# Patient Record
Sex: Female | Born: 1975 | Race: Black or African American | Hispanic: No | Marital: Married | Smoking: Never smoker
Health system: Southern US, Community
[De-identification: ages and names within clinical notes are randomized; demographics above are authoritative.]

## PROBLEM LIST (undated history)

## (undated) DIAGNOSIS — M719 Bursopathy, unspecified: Secondary | ICD-10-CM

## (undated) DIAGNOSIS — I10 Essential (primary) hypertension: Secondary | ICD-10-CM

## (undated) DIAGNOSIS — U071 COVID-19: Secondary | ICD-10-CM

## (undated) DIAGNOSIS — G43909 Migraine, unspecified, not intractable, without status migrainosus: Secondary | ICD-10-CM

## (undated) DIAGNOSIS — E119 Type 2 diabetes mellitus without complications: Secondary | ICD-10-CM

---

## 2004-07-12 ENCOUNTER — Emergency Department: Payer: Self-pay | Admitting: Emergency Medicine

## 2006-04-26 ENCOUNTER — Emergency Department: Payer: Self-pay | Admitting: Emergency Medicine

## 2006-11-04 ENCOUNTER — Emergency Department: Payer: Self-pay | Admitting: General Practice

## 2007-04-02 ENCOUNTER — Emergency Department: Payer: Self-pay | Admitting: Emergency Medicine

## 2007-07-28 ENCOUNTER — Emergency Department: Payer: Self-pay | Admitting: Emergency Medicine

## 2007-11-11 ENCOUNTER — Emergency Department: Payer: Self-pay | Admitting: Emergency Medicine

## 2008-01-07 ENCOUNTER — Emergency Department: Payer: Self-pay | Admitting: Emergency Medicine

## 2009-06-24 ENCOUNTER — Emergency Department: Payer: Self-pay | Admitting: Emergency Medicine

## 2009-10-29 ENCOUNTER — Emergency Department: Payer: Self-pay | Admitting: Emergency Medicine

## 2010-03-31 ENCOUNTER — Emergency Department: Payer: Self-pay | Admitting: Emergency Medicine

## 2010-05-20 ENCOUNTER — Other Ambulatory Visit: Payer: Self-pay | Admitting: Obstetrics and Gynecology

## 2010-07-25 ENCOUNTER — Emergency Department: Payer: Self-pay | Admitting: Emergency Medicine

## 2010-10-10 ENCOUNTER — Emergency Department: Payer: Self-pay | Admitting: Emergency Medicine

## 2011-01-02 ENCOUNTER — Encounter: Payer: Self-pay | Admitting: Maternal & Fetal Medicine

## 2011-01-03 ENCOUNTER — Ambulatory Visit: Payer: Self-pay

## 2011-01-09 ENCOUNTER — Observation Stay: Payer: Self-pay

## 2011-01-10 ENCOUNTER — Ambulatory Visit: Payer: Self-pay | Admitting: Obstetrics and Gynecology

## 2011-01-11 ENCOUNTER — Ambulatory Visit: Payer: Self-pay

## 2011-01-31 ENCOUNTER — Observation Stay: Payer: Self-pay | Admitting: Obstetrics & Gynecology

## 2012-12-18 ENCOUNTER — Emergency Department: Payer: Self-pay | Admitting: Emergency Medicine

## 2012-12-18 LAB — CBC
HCT: 36.4 % (ref 35.0–47.0)
HGB: 12.3 g/dL (ref 12.0–16.0)
MCH: 29.6 pg (ref 26.0–34.0)
MCHC: 34 g/dL (ref 32.0–36.0)
MCV: 87 fL (ref 80–100)
Platelet: 212 10*3/uL (ref 150–440)
RBC: 4.18 10*6/uL (ref 3.80–5.20)
RDW: 13.2 % (ref 11.5–14.5)
WBC: 11.4 10*3/uL — ABNORMAL HIGH (ref 3.6–11.0)

## 2012-12-18 LAB — URINALYSIS, COMPLETE
Bacteria: NONE SEEN
Bilirubin,UR: NEGATIVE
Glucose,UR: NEGATIVE mg/dL (ref 0–75)
Ketone: NEGATIVE
Leukocyte Esterase: NEGATIVE
Nitrite: NEGATIVE
Ph: 7 (ref 4.5–8.0)
Protein: NEGATIVE
RBC,UR: <1 /HPF (ref 0–5)
Specific Gravity: 1.008 (ref 1.003–1.030)
Squamous Epithelial: 1
WBC UR: <1 /HPF (ref 0–5)

## 2012-12-18 LAB — COMPREHENSIVE METABOLIC PANEL
Albumin: 3.7 g/dL (ref 3.4–5.0)
Alkaline Phosphatase: 91 U/L (ref 50–136)
Anion Gap: 8 (ref 7–16)
BUN: 12 mg/dL (ref 7–18)
Bilirubin,Total: 0.4 mg/dL (ref 0.2–1.0)
Calcium, Total: 9.1 mg/dL (ref 8.5–10.1)
Chloride: 105 mmol/L (ref 98–107)
Co2: 24 mmol/L (ref 21–32)
Creatinine: 0.74 mg/dL (ref 0.60–1.30)
EGFR (African American): >60
EGFR (Non-African Amer.): >60
Glucose: 102 mg/dL — ABNORMAL HIGH (ref 65–99)
Osmolality: 274 (ref 275–301)
Potassium: 3.9 mmol/L (ref 3.5–5.1)
SGOT(AST): 16 U/L (ref 15–37)
SGPT (ALT): 34 U/L (ref 12–78)
Sodium: 137 mmol/L (ref 136–145)
Total Protein: 7.8 g/dL (ref 6.4–8.2)

## 2012-12-18 LAB — CK TOTAL AND CKMB (NOT AT ARMC)
CK, Total: 89 U/L (ref 21–215)
CK-MB: <0.5 ng/mL — ABNORMAL LOW (ref 0.5–3.6)

## 2012-12-18 LAB — TROPONIN I: Troponin-I: <0.02 ng/mL

## 2013-09-17 ENCOUNTER — Emergency Department: Payer: Self-pay | Admitting: Emergency Medicine

## 2014-06-25 ENCOUNTER — Emergency Department: Payer: Self-pay | Admitting: Emergency Medicine

## 2014-06-25 LAB — URINALYSIS, COMPLETE
Bacteria: NONE SEEN
Bilirubin,UR: NEGATIVE
Glucose,UR: NEGATIVE mg/dL (ref 0–75)
Ketone: NEGATIVE
Leukocyte Esterase: NEGATIVE
Nitrite: NEGATIVE
Ph: 6 (ref 4.5–8.0)
Protein: NEGATIVE
RBC,UR: 1 /HPF (ref 0–5)
Specific Gravity: 1.005 (ref 1.003–1.030)
Squamous Epithelial: NONE SEEN
WBC UR: NONE SEEN /HPF (ref 0–5)

## 2014-06-25 LAB — CBC
HCT: 35.8 % (ref 35.0–47.0)
HGB: 11.8 g/dL — ABNORMAL LOW (ref 12.0–16.0)
MCH: 29.3 pg (ref 26.0–34.0)
MCHC: 32.9 g/dL (ref 32.0–36.0)
MCV: 89 fL (ref 80–100)
Platelet: 204 10*3/uL (ref 150–440)
RBC: 4.02 10*6/uL (ref 3.80–5.20)
RDW: 13.4 % (ref 11.5–14.5)
WBC: 14 10*3/uL — ABNORMAL HIGH (ref 3.6–11.0)

## 2014-06-25 LAB — HCG, QUANTITATIVE, PREGNANCY: Beta Hcg, Quant.: 45834 m[IU]/mL — ABNORMAL HIGH

## 2014-08-21 ENCOUNTER — Emergency Department: Payer: Self-pay | Admitting: Emergency Medicine

## 2014-08-30 ENCOUNTER — Observation Stay: Payer: Self-pay | Admitting: Obstetrics & Gynecology

## 2015-08-29 ENCOUNTER — Encounter: Payer: Self-pay | Admitting: Emergency Medicine

## 2015-08-29 ENCOUNTER — Emergency Department
Admission: EM | Admit: 2015-08-29 | Discharge: 2015-08-29 | Disposition: A | Discharge status: Home / Self Care | Payer: Worker's Compensation | Attending: Emergency Medicine | Admitting: Emergency Medicine

## 2015-08-29 DIAGNOSIS — M25511 Pain in right shoulder: Secondary | ICD-10-CM | POA: Diagnosis not present

## 2015-08-29 DIAGNOSIS — M542 Cervicalgia: Secondary | ICD-10-CM | POA: Diagnosis present

## 2015-08-29 DIAGNOSIS — M545 Low back pain, unspecified: Secondary | ICD-10-CM

## 2015-08-29 DIAGNOSIS — M7918 Myalgia, other site: Secondary | ICD-10-CM

## 2015-08-29 DIAGNOSIS — I10 Essential (primary) hypertension: Secondary | ICD-10-CM | POA: Insufficient documentation

## 2015-08-29 HISTORY — DX: Essential (primary) hypertension: I10

## 2015-08-29 MED ORDER — CYCLOBENZAPRINE HCL 10 MG PO TABS: 10.0000 mg | ORAL_TABLET | Freq: Three times a day (TID) | ORAL | Status: DC | PRN

## 2015-08-29 MED ORDER — DIAZEPAM 5 MG PO TABS
5.0000 mg | ORAL_TABLET | Freq: Once | ORAL | Status: AC
  Administered 2015-08-29: 5 mg via ORAL
  Filled 2015-08-29: qty 1

## 2015-08-29 MED ORDER — LIDOCAINE 5 % EX PTCH: 1.0000 | MEDICATED_PATCH | CUTANEOUS | Status: AC

## 2015-08-29 MED ORDER — ETODOLAC 200 MG PO CAPS: 200.0000 mg | ORAL_CAPSULE | Freq: Three times a day (TID) | ORAL | Status: DC

## 2015-08-29 MED ORDER — LIDOCAINE 5 % EX PTCH
1.0000 | MEDICATED_PATCH | CUTANEOUS | Status: DC
  Administered 2015-08-29: 1 via TRANSDERMAL
  Filled 2015-08-29 (×2): qty 1

## 2015-08-29 MED ORDER — KETOROLAC TROMETHAMINE 60 MG/2ML IM SOLN
60.0000 mg | Freq: Once | INTRAMUSCULAR | Status: AC
  Administered 2015-08-29: 60 mg via INTRAMUSCULAR
  Filled 2015-08-29: qty 2

## 2015-08-29 NOTE — ED Notes (Signed)
Urine for WC UDS collected and signed over to lab.

## 2015-08-29 NOTE — Discharge Instructions (Signed)
Back Pain, Adult °Back pain is very common in adults. The cause of back pain is rarely dangerous and the pain often gets better over time. The cause of your back pain may not be known. Some common causes of back pain include: °· Strain of the muscles or ligaments supporting the spine. °· Wear and tear (degeneration) of the spinal disks. °· Arthritis. °· Direct injury to the back. °For many people, back pain may return. Since back pain is rarely dangerous, most people can learn to manage this condition on their own. °HOME CARE INSTRUCTIONS °Watch your back pain for any changes. The following actions may help to lessen any discomfort you are feeling: °· Remain active. It is stressful on your back to sit or stand in one place for long periods of time. Do not sit, drive, or stand in one place for more than 30 minutes at a time. Take short walks on even surfaces as soon as you are able. Try to increase the length of time you walk each day. °· Exercise regularly as directed by your health care provider. Exercise helps your back heal faster. It also helps avoid future injury by keeping your muscles strong and flexible. °· Do not stay in bed. Resting more than 1-2 days can delay your recovery. °· Pay attention to your body when you bend and lift. The most comfortable positions are those that put less stress on your recovering back. Always use proper lifting techniques, including: °¨ Bending your knees. °¨ Keeping the load close to your body. °¨ Avoiding twisting. °· Find a comfortable position to sleep. Use a firm mattress and lie on your side with your knees slightly bent. If you lie on your back, put a pillow under your knees. °· Avoid feeling anxious or stressed. Stress increases muscle tension and can worsen back pain. It is important to recognize when you are anxious or stressed and learn ways to manage it, such as with exercise. °· Take medicines only as directed by your health care provider. Over-the-counter  medicines to reduce pain and inflammation are often the most helpful. Your health care provider may prescribe muscle relaxant drugs. These medicines help dull your pain so you can more quickly return to your normal activities and healthy exercise. °· Apply ice to the injured area: °¨ Put ice in a plastic bag. °¨ Place a towel between your skin and the bag. °¨ Leave the ice on for 20 minutes, 2-3 times a day for the first 2-3 days. After that, ice and heat may be alternated to reduce pain and spasms. °· Maintain a healthy weight. Excess weight puts extra stress on your back and makes it difficult to maintain good posture. °SEEK MEDICAL CARE IF: °· You have pain that is not relieved with rest or medicine. °· You have increasing pain going down into the legs or buttocks. °· You have pain that does not improve in one week. °· You have night pain. °· You lose weight. °· You have a fever or chills. °SEEK IMMEDIATE MEDICAL CARE IF:  °· You develop new bowel or bladder control problems. °· You have unusual weakness or numbness in your arms or legs. °· You develop nausea or vomiting. °· You develop abdominal pain. °· You feel faint. °  °This information is not intended to replace advice given to you by your health care provider. Make sure you discuss any questions you have with your health care provider. °  °Document Released: 05/29/2005 Document Revised: 06/19/2014 Document Reviewed: 09/30/2013 °Elsevier Interactive Patient Education ©2016 [employer]  Musculoskeletal Pain Musculoskeletal pain is muscle and boney aches and pains. These pains can occur in any part of the body. Your caregiver may treat you without knowing the cause of the pain. They may treat you if blood or urine tests, X-rays, and other tests were normal.  CAUSES There is often not a definite cause or reason for these pains. These pains may be caused by a type of germ (virus). The discomfort may also come from overuse. Overuse includes working out  too hard when your body is not fit. Boney aches also come from weather changes. Bone is sensitive to atmospheric pressure changes. HOME CARE INSTRUCTIONS   Ask when your test results will be ready. Make sure you get your test results.  Only take over-the-counter or prescription medicines for pain, discomfort, or fever as directed by your caregiver. If you were given medications for your condition, do not drive, operate machinery or power tools, or sign legal documents for 24 hours. Do not drink alcohol. Do not take sleeping pills or other medications that may interfere with treatment.  Continue all activities unless the activities cause more pain. When the pain lessens, slowly resume normal activities. Gradually increase the intensity and duration of the activities or exercise.  During periods of severe pain, bed rest may be helpful. Lay or sit in any position that is comfortable.  Putting ice on the injured area.  Put ice in a bag.  Place a towel between your skin and the bag.  Leave the ice on for 15 to 20 minutes, 3 to 4 times a day.  Follow up with your caregiver for continued problems and no reason can be found for the pain. If the pain becomes worse or does not go away, it may be necessary to repeat tests or do additional testing. Your caregiver may need to look further for a possible cause. SEEK IMMEDIATE MEDICAL CARE IF:  You have pain that is getting worse and is not relieved by medications.  You develop chest pain that is associated with shortness or breath, sweating, feeling sick to your stomach (nauseous), or throw up (vomit).  Your pain becomes localized to the abdomen.  You develop any new symptoms that seem different or that concern you. MAKE SURE YOU:   Understand these instructions.  Will watch your condition.  Will get help right away if you are not doing well or get worse.   This information is not intended to replace advice given to you by your health care  provider. Make sure you discuss any questions you have with your health care provider.   Document Released: 05/29/2005 Document Revised: 08/21/2011 Document Reviewed: 01/31/2013 Elsevier Interactive Patient Education 2016 Elsevier Inc.  Shoulder Pain The shoulder is the joint that connects your arms to your body. The bones that form the shoulder joint include the upper arm bone (humerus), the shoulder blade (scapula), and the collarbone (clavicle). The top of the humerus is shaped like a ball and fits into a rather flat socket on the scapula (glenoid cavity). A combination of muscles and strong, fibrous tissues that connect muscles to bones (tendons) support your shoulder joint and hold the ball in the socket. Small, fluid-filled sacs (bursae) are located in different areas of the joint. They act as cushions between the bones and the overlying soft tissues and help reduce friction between the gliding tendons and the bone as you move your arm. Your shoulder joint allows a wide range of motion in your arm.  This range of motion allows you to do things like scratch your back or throw a ball. However, this range of motion also makes your shoulder more prone to pain from overuse and injury. Causes of shoulder pain can originate from both injury and overuse and usually can be grouped in the following four categories:  Redness, swelling, and pain (inflammation) of the tendon (tendinitis) or the bursae (bursitis).  Instability, such as a dislocation of the joint.  Inflammation of the joint (arthritis).  Broken bone (fracture). HOME CARE INSTRUCTIONS   Apply ice to the sore area.  Put ice in a plastic bag.  Place a towel between your skin and the bag.  Leave the ice on for 15-20 minutes, 3-4 times per day for the first 2 days, or as directed by your health care provider.  Stop using cold packs if they do not help with the pain.  If you have a shoulder sling or immobilizer, wear it as long as your  caregiver instructs. Only remove it to shower or bathe. Move your arm as little as possible, but keep your hand moving to prevent swelling.  Squeeze a soft ball or foam pad as much as possible to help prevent swelling.  Only take over-the-counter or prescription medicines for pain, discomfort, or fever as directed by your caregiver. SEEK MEDICAL CARE IF:   Your shoulder pain increases, or new pain develops in your arm, hand, or fingers.  Your hand or fingers become cold and numb.  Your pain is not relieved with medicines. SEEK IMMEDIATE MEDICAL CARE IF:   Your arm, hand, or fingers are numb or tingling.  Your arm, hand, or fingers are significantly swollen or turn white or blue. MAKE SURE YOU:   Understand these instructions.  Will watch your condition.  Will get help right away if you are not doing well or get worse.   This information is not intended to replace advice given to you by your health care provider. Make sure you discuss any questions you have with your health care provider.   Document Released: 03/08/2005 Document Revised: 06/19/2014 Document Reviewed: 09/21/2014 Elsevier Interactive Patient Education [employer]

## 2015-08-29 NOTE — ED Provider Notes (Signed)
[facility] Emergency Department Provider Note  ____________________________________________  Time seen: Approximately 530 AM  I have reviewed the triage vital signs and the nursing notes.   HISTORY  Chief Complaint Back Pain; Shoulder Pain; and Neck Pain    HPI Helen Webster is a 40 y.o. female who comes into the hospital today with some back pain and neck pain and shoulder pain. The patient reports that she was hurt at work [employer]. The patient was trying to assist a lady off the toilet and the lady fell. The patientports that her arm was still under the lady when she fell. She did not fall onto the floor but her arm, back and neck was pulled. The patient reports that she is having 10 out of 10 pain. She's never had pain like this before. The patient was given some Tylenol at work before she came in but it didn't help. The patient reports that she is able to walk but it hurts when she is moving her neck, arm and back. The patient is here for treatment and evaluation of her symptoms.   Past Medical History  Diagnosis Date  . Hypertension     There are no active problems to display for this patient.   Past Surgical History  Procedure Laterality Date  . Cesarean section      Current Outpatient Rx  Name  Route  Sig  Dispense  Refill  . cyclobenzaprine (FLEXERIL) 10 MG tablet   Oral   Take 1 tablet (10 mg total) by mouth every 8 (eight) hours as needed for muscle spasms.   15 tablet   0   . etodolac (LODINE) 200 MG capsule   Oral   Take 1 capsule (200 mg total) by mouth every 8 (eight) hours.   12 capsule   0   . lidocaine (LIDODERM) 5 %   Transdermal   Place 1 patch onto the skin daily. Remove & Discard patch within 12 hours or as directed by MD   10 patch   0     Allergies Review of patient's allergies indicates no known allergies.  History reviewed. No pertinent family history.  Social History Social History  Substance Use Topics  .  Smoking status: Never Smoker   . Smokeless tobacco: None  . Alcohol Use: No    Review of Systems Constitutional: No fever/chills Eyes: No visual changes. ENT: No sore throat. Cardiovascular: Denies chest pain. Respiratory: Denies shortness of breath. Gastrointestinal: No abdominal pain.  No nausea, no vomiting.  No diarrhea.  No constipation. Genitourinary: Negative for dysuria. Musculoskeletal: Back pain, neck pain, shoulder pain Skin: Negative for rash. Neurological: Negative for headaches, focal weakness or numbness.  10-point ROS otherwise negative.  ____________________________________________   PHYSICAL EXAM:  VITAL SIGNS: ED Triage Vitals  Enc Vitals Group     BP 08/29/15 0218 146/87 mmHg     Pulse Rate 08/29/15 0218 80     Resp 08/29/15 0218 18     Temp 08/29/15 0218 98.5 F (36.9 C)     Temp Source 08/29/15 0218 Oral     SpO2 08/29/15 0218 100 %     Weight 08/29/15 0218 180 lb (81.647 kg)     Height 08/29/15 0218  (1.626 m)     Head Cir --      Peak Flow --      Pain Score 08/29/15 0219 8     Pain Loc --      Pain Edu? --  Excl. in GC? --     Constitutional: Alert and oriented. Well appearing and in mild distress. Eyes: Conjunctivae are normal. PERRL. EOMI. Head: Atraumatic. Nose: No congestion/rhinnorhea. Mouth/Throat: Mucous membranes are moist.  Oropharynx non-erythematous. Neck: No cervical spine tenderness to palpation. Cardiovascular: Normal rate, regular rhythm. Grossly normal heart sounds.  Good peripheral circulation. Respiratory: Normal respiratory effort.  No retractions. Lungs CTAB. Gastrointestinal: Soft and nontender. No distention. Positive bowel sounds Musculoskeletal: Lateral neck with tenderness to palpation, pain to palpation of the shoulder as well with inability to lift above the head. Mild tenderness to palpation to right lower back as well no midline tenderness. Neurologic:  Normal speech and language.  Skin:  Skin is  warm, dry and intact.  Psychiatric: Mood and affect are normal. Speech and behavior are normal.  ____________________________________________   LABS (all labs ordered are listed, but only abnormal results are displayed)  Labs Reviewed - No data to display ____________________________________________  EKG  None ____________________________________________  RADIOLOGY  None ____________________________________________   PROCEDURES  Procedure(s) performed: None  Critical Care performed: No  ____________________________________________   INITIAL IMPRESSION / ASSESSMENT AND PLAN / ED COURSE  Pertinent labs & imaging results that were available during my care of the patient were reviewed by me and considered in my medical decision making (see chart for details).  This is a 40 year old female who comes into the hospital today after being hurt at work. The patient rates her pain a 10 out of 10. I did give the patient dose of Toradol as well as Valium and a Lidoderm patch. The patient will be discharged to follow-up with orthopedic surgery who can evaluate her for possible rotator cuff injury. I will give the patient and no further work and have her follow-up. She should avoid strenuous activity until she is seen by orthopedic surgery. The patient will be discharged home. ____________________________________________   FINAL CLINICAL IMPRESSION(S) / ED DIAGNOSES  Final diagnoses:  Musculoskeletal pain  Neck pain  Right-sided low back pain without sciatica  Right shoulder pain      Rebecka ApleyAllison P Belton Peplinski, MD 08/29/15 [phone]

## 2015-08-29 NOTE — ED Notes (Signed)
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 Pt ambulatory with steady gait. No distress noted.

## 2015-08-29 NOTE — ED Notes (Signed)
Sling applied to patients arm.

## 2016-06-24 ENCOUNTER — Emergency Department
Admission: EM | Admit: 2016-06-24 | Discharge: 2016-06-24 | Disposition: A | Discharge status: Home / Self Care | Payer: Medicaid Other | Attending: Emergency Medicine | Admitting: Emergency Medicine

## 2016-06-24 ENCOUNTER — Emergency Department: Payer: Medicaid Other

## 2016-06-24 DIAGNOSIS — J09X2 Influenza due to identified novel influenza A virus with other respiratory manifestations: Secondary | ICD-10-CM | POA: Insufficient documentation

## 2016-06-24 DIAGNOSIS — J101 Influenza due to other identified influenza virus with other respiratory manifestations: Secondary | ICD-10-CM

## 2016-06-24 DIAGNOSIS — I10 Essential (primary) hypertension: Secondary | ICD-10-CM | POA: Insufficient documentation

## 2016-06-24 LAB — INFLUENZA PANEL BY PCR (TYPE A & B)
Influenza A By PCR: POSITIVE — AB
Influenza B By PCR: NEGATIVE

## 2016-06-24 MED ORDER — OSELTAMIVIR PHOSPHATE 75 MG PO CAPS: 75.0000 mg | ORAL_CAPSULE | Freq: Two times a day (BID) | ORAL | 0 refills | Status: AC

## 2016-06-24 MED ORDER — ACETAMINOPHEN 325 MG PO TABS
ORAL_TABLET | ORAL | Status: AC
  Filled 2016-06-24: qty 2

## 2016-06-24 MED ORDER — KETOROLAC TROMETHAMINE 60 MG/2ML IM SOLN
60.0000 mg | Freq: Once | INTRAMUSCULAR | Status: AC
  Administered 2016-06-24: 60 mg via INTRAMUSCULAR
  Filled 2016-06-24: qty 2

## 2016-06-24 MED ORDER — ACETAMINOPHEN 325 MG PO TABS
650.0000 mg | ORAL_TABLET | Freq: Once | ORAL | Status: AC | PRN
  Administered 2016-06-24: 650 mg via ORAL

## 2016-06-24 MED ORDER — OSELTAMIVIR PHOSPHATE 75 MG PO CAPS
75.0000 mg | ORAL_CAPSULE | Freq: Once | ORAL | Status: AC
  Administered 2016-06-24: 75 mg via ORAL
  Filled 2016-06-24: qty 1

## 2016-06-24 NOTE — ED Notes (Signed)
Pt began with flu like sx yesterday . Pt stating that everyone where she works has been sick. Pt has body aches, HA , and CP along with cough and fever.

## 2016-06-24 NOTE — ED Notes (Signed)
Pt given ginger ale to increase her oral intake.

## 2016-06-24 NOTE — ED Triage Notes (Signed)
Pt ambulatory to triage with no difficulty. Pt reports she works at [employer] and they have had several cases of flu there. Pt reports she started last night with flu symptoms and a fever of 101.

## 2016-06-24 NOTE — ED Provider Notes (Signed)
[facility] Emergency Department Provider Note   ____________________________________________   First MD Initiated Contact with Patient 06/24/16 [id number]     (approximate)  I have reviewed the triage vital signs and the nursing notes.   HISTORY  Chief Complaint Influenza    HPI Helen Webster is a 41 y.o. female who comes into the hospital today with flu symptoms. The patient reports that she's having body aches and elevated temperature, cough, sore throat and chills. She reports that there is a lot of fluid nor virus at her job so they have been quarantined because of this. Started having symptoms yesterday. She took Tylenol Cold and flu as well as Alka-Seltzer but it did not help her symptoms. The patient denies any nausea or vomiting and has had some occasional shortness of breath. She is been drinking fluids at home but reports that she still feels unwell. She has no other sick contacts aside from her job. She decided to come in tonight for evaluation.   Past Medical History:  Diagnosis Date  . Hypertension     There are no active problems to display for this patient.   Past Surgical History:  Procedure Laterality Date  . CESAREAN SECTION      Prior to Admission medications   Medication Sig Start Date End Date Taking? Authorizing Provider  cyclobenzaprine (FLEXERIL) 10 MG tablet Take 1 tablet (10 mg total) by mouth every 8 (eight) hours as needed for muscle spasms. 08/29/15   Rebecka ApleyAllison P Kaelen Brennan, MD  etodolac (LODINE) 200 MG capsule Take 1 capsule (200 mg total) by mouth every 8 (eight) hours. 08/29/15   Rebecka ApleyAllison P Khale Nigh, MD  lidocaine (LIDODERM) 5 % Place 1 patch onto the skin daily. Remove & Discard patch within 12 hours or as directed by MD 08/29/15 08/28/16  Rebecka ApleyAllison P Kenith Trickel, MD  oseltamivir (TAMIFLU) 75 MG capsule Take 1 capsule (75 mg total) by mouth 2 (two) times daily. 06/24/16 06/29/16  Rebecka ApleyAllison P Dione Petron, MD    Allergies Patient has no known  allergies.  No family history on file.  Social History Social History  Substance Use Topics  . Smoking status: Never Smoker  . Smokeless tobacco: Not on file  . Alcohol use No    Review of Systems Constitutional:  fever/chills Eyes: No visual changes. ENT:  sore throat. Cardiovascular: Denies chest pain. Respiratory: Cough and occasional shortness of breath. Gastrointestinal: No abdominal pain.  No nausea, no vomiting.  No diarrhea.  No constipation. Genitourinary: Negative for dysuria. Musculoskeletal: Body aches. Skin: Negative for rash. Neurological: Headache  10-point ROS otherwise negative.  ____________________________________________   PHYSICAL EXAM:  VITAL SIGNS: ED Triage Vitals  Enc Vitals Group     BP 06/24/16 0330 (!) 150/98     Pulse Rate 06/24/16 0330 (!) 114     Resp 06/24/16 0330 20     Temp 06/24/16 0330 (!) 101.1 F (38.4 C)     Temp Source 06/24/16 0330 Oral     SpO2 06/24/16 0330 97 %     Weight 06/24/16 0325 170 lb (77.1 kg)     Height 06/24/16 0325 5\' 4"  (1.626 m)     Head Circumference --      Peak Flow --      Pain Score 06/24/16 0326 10     Pain Loc --      Pain Edu? --      Excl. in GC? --     Constitutional: Alert and oriented. Ill appearing and  in moderate distress. Eyes: Conjunctivae are normal. PERRL. EOMI. Head: Atraumatic. Nose: No congestion/rhinnorhea. Mouth/Throat: Mucous membranes are moist.  Oropharynx mildly erythematous. Neck: Supple with no meningismus Cardiovascular: Tachycardia, regular rhythm. Grossly normal heart sounds.  Good peripheral circulation. Respiratory: Normal respiratory effort.  No retractions. Lungs CTAB. Gastrointestinal: Soft and nontender. No distention. Positive bowel sounds Musculoskeletal: No lower extremity tenderness nor edema.   Neurologic:  Normal speech and language.  Skin:  Skin is warm, dry and intact.  Psychiatric: Mood and affect are normal.    ____________________________________________   LABS (all labs ordered are listed, but only abnormal results are displayed)  Labs Reviewed  INFLUENZA PANEL BY PCR (TYPE A & B, H1N1) - Abnormal; Notable for the following:       Result Value   Influenza A By PCR POSITIVE (*)    All other components within normal limits   ____________________________________________  EKG  none ____________________________________________  RADIOLOGY  CXR ____________________________________________   PROCEDURES  Procedure(s) performed: None  Procedures  Critical Care performed: No  ____________________________________________   INITIAL IMPRESSION / ASSESSMENT AND PLAN / ED COURSE  Pertinent labs & imaging results that were available during my care of the patient were reviewed by me and considered in my medical decision making (see chart for details).  This is a 41 year old female who comes into the hospital today with flulike symptoms. The patient did have a temperature when she initially arrived. She received some Tylenol. I will give her a shot of Toradol as well as some Tamiflu as her flu test was positive. I will then reassess the patient and ensure she is able to take by mouth without any vomiting.  Clinical Course as of Jun 25 715  Sat Jun 24, 2016  0707 No active cardiopulmonary disease. DG Chest 2 View [AW]    Clinical Course User Index [AW] Rebecka Apley, MD   The patient's CXR is unremarkable. Her fever and vital signs are improved. She will be discharged.   ____________________________________________   FINAL CLINICAL IMPRESSION(S) / ED DIAGNOSES  Final diagnoses:  Influenza A      NEW MEDICATIONS STARTED DURING THIS VISIT:  New Prescriptions   OSELTAMIVIR (TAMIFLU) 75 MG CAPSULE    Take 1 capsule (75 mg total) by mouth 2 (two) times daily.     Note:  This document was prepared using Dragon voice recognition software and may include unintentional  dictation errors.    Rebecka Apley, MD 06/24/16 [phone]

## 2016-06-24 NOTE — ED Notes (Signed)
Pt verbalized understanding of discharge instructions. NAD at this time. 

## 2016-07-12 ENCOUNTER — Encounter: Payer: Self-pay | Admitting: *Deleted

## 2016-07-12 ENCOUNTER — Emergency Department
Admission: EM | Admit: 2016-07-12 | Discharge: 2016-07-12 | Disposition: A | Discharge status: Home / Self Care | Payer: Self-pay | Attending: Emergency Medicine | Admitting: Emergency Medicine

## 2016-07-12 ENCOUNTER — Emergency Department: Payer: Self-pay

## 2016-07-12 DIAGNOSIS — I10 Essential (primary) hypertension: Secondary | ICD-10-CM | POA: Insufficient documentation

## 2016-07-12 DIAGNOSIS — R05 Cough: Secondary | ICD-10-CM | POA: Insufficient documentation

## 2016-07-12 DIAGNOSIS — R059 Cough, unspecified: Secondary | ICD-10-CM

## 2016-07-12 DIAGNOSIS — J029 Acute pharyngitis, unspecified: Secondary | ICD-10-CM | POA: Insufficient documentation

## 2016-07-12 DIAGNOSIS — R519 Headache, unspecified: Secondary | ICD-10-CM

## 2016-07-12 DIAGNOSIS — Z79899 Other long term (current) drug therapy: Secondary | ICD-10-CM | POA: Insufficient documentation

## 2016-07-12 DIAGNOSIS — R51 Headache: Secondary | ICD-10-CM | POA: Insufficient documentation

## 2016-07-12 MED ORDER — ACETAMINOPHEN 325 MG PO TABS
ORAL_TABLET | ORAL | Status: AC
  Filled 2016-07-12: qty 2

## 2016-07-12 MED ORDER — ACETAMINOPHEN 325 MG PO TABS
650.0000 mg | ORAL_TABLET | Freq: Once | ORAL | Status: AC
  Administered 2016-07-12: 650 mg via ORAL

## 2016-07-12 MED ORDER — AMOXICILLIN 500 MG PO TABS: 500.0000 mg | ORAL_TABLET | Freq: Three times a day (TID) | ORAL | 0 refills | Status: DC

## 2016-07-12 MED ORDER — GUAIFENESIN-CODEINE 100-10 MG/5ML PO SOLN: 10.0000 mL | Freq: Three times a day (TID) | ORAL | 0 refills | Status: DC | PRN

## 2016-07-12 NOTE — ED Provider Notes (Signed)
[facility] Emergency Department Provider Note  ____________________________________________  Time seen: Approximately 3:57 PM  I have reviewed the triage vital signs and the nursing notes.   HISTORY  Chief Complaint Cough   HPI Helen Webster is a 41 y.o. female who presents to the emergency department for evaluation of cough, left side facial pain, sore throat and ear ache. She denies fever. Diagnosed with the flu a few weeks ago.   Past Medical History:  Diagnosis Date  . Hypertension     There are no active problems to display for this patient.   Past Surgical History:  Procedure Laterality Date  . CESAREAN SECTION      Prior to Admission medications   Medication Sig Start Date End Date Taking? Authorizing Provider  amoxicillin (AMOXIL) 500 MG tablet Take 1 tablet (500 mg total) by mouth 3 (three) times daily. 07/12/16   Chinita Pesterari B Laquincy Eastridge, FNP  cyclobenzaprine (FLEXERIL) 10 MG tablet Take 1 tablet (10 mg total) by mouth every 8 (eight) hours as needed for muscle spasms. 08/29/15   Rebecka ApleyAllison P Webster, MD  etodolac (LODINE) 200 MG capsule Take 1 capsule (200 mg total) by mouth every 8 (eight) hours. 08/29/15   Rebecka ApleyAllison P Webster, MD  guaiFENesin-codeine 100-10 MG/5ML syrup Take 10 mLs by mouth 3 (three) times daily as needed. 07/12/16   Chinita Pesterari B Jonee Lamore, FNP  lidocaine (LIDODERM) 5 % Place 1 patch onto the skin daily. Remove & Discard patch within 12 hours or as directed by MD 08/29/15 08/28/16  Rebecka ApleyAllison P Webster, MD    Allergies Patient has no known allergies.  History reviewed. No pertinent family history.  Social History Social History  Substance Use Topics  . Smoking status: Never Smoker  . Smokeless tobacco: Not on file  . Alcohol use No    Review of Systems Constitutional: Negative for fever/chills ENT: Positive for sore throat. Positive for left side facial pain. Cardiovascular: Denies chest pain. Respiratory: Negative for shortness of breath.  Positive for cough. Gastrointestinal: Negative for nausea,  Negative for vomiting.  Negative for diarrhea.  Musculoskeletal: Positive for body aches Skin: Negative  for rash. Neurological: Positive for headaches ____________________________________________   PHYSICAL EXAM:  VITAL SIGNS: ED Triage Vitals [07/12/16 0814]  Enc Vitals Group     BP 140/86     Pulse Rate (!) 106     Resp 18     Temp 98.7 F (37.1 C)     Temp Source Oral     SpO2 96 %     Weight 170 lb (77.1 kg)     Height 5\' 4"  (1.626 m)     Head Circumference      Peak Flow      Pain Score 10     Pain Loc      Pain Edu?      Excl. in GC?     Constitutional: Alert and oriented. Well appearing and in no acute distress. Eyes: Conjunctivae are normal. EOMI. Ears: Bilateral tympanic membranes are normal Nose: No congestion; no rhinnorhea. Mouth/Throat: Mucous membranes are moist.  Oropharynx normal. Tonsils appear 1+ without exudate. Neck: No stridor.  Lymphatic: No cervical lymphadenopathy. Cardiovascular: Normal rate, regular rhythm. Grossly normal heart sounds.  Good peripheral circulation. Respiratory: Normal respiratory effort.  No retractions. Breath sounds clear throughout to auscultation. Gastrointestinal: Soft and nontender.  Musculoskeletal: FROM x 4 extremities.  Neurologic:  Normal speech and language.  Skin:  Skin is warm, dry and intact. No rash noted. Psychiatric:  Mood and affect are normal. Speech and behavior are normal.  ____________________________________________   LABS (all labs ordered are listed, but only abnormal results are displayed)  Labs Reviewed - No data to display ____________________________________________  EKG  Not indicated ____________________________________________  RADIOLOGY  No active cardiopulmonary disease per radiology. ____________________________________________   PROCEDURES  Procedure(s) performed: None  Critical Care performed:  No  ____________________________________________   INITIAL IMPRESSION / ASSESSMENT AND PLAN / ED COURSE     Pertinent labs & imaging results that were available during my care of the patient were reviewed by me and considered in my medical decision making (see chart for details).   -year-old female presenting to the emergency department for evaluation of severe headache and sinus pain with cough. Recent diagnosis of influenza. Chest x-ray today is negative for acute cardiopulmonary abnormality. She'll be treated with oxacillin and Robitussin-AC. She was encouraged to follow up with her primary care provider for symptoms that are not improving over the next few days or return to the emergency department for symptoms that change or worsen if she is unable schedule an appointment. ____________________________________________   FINAL CLINICAL IMPRESSION(S) / ED DIAGNOSES  Final diagnoses:  Sinus headache  Cough    Note:  This document was prepared using Dragon voice recognition software and may include unintentional dictation errors.     Chinita Pester, FNP 07/17/16 2258    Arnaldo Natal, MD 07/19/16 [phone]

## 2016-07-12 NOTE — ED Triage Notes (Signed)
States she was diagnoised with the flu last week, states she continues to have cough and sore throat with ear aches, pt awake and alert in no acute distress

## 2016-07-12 NOTE — ED Notes (Signed)
Nurse first note   States she had the flu about 3 weeks ago .  Now having cough and left sided face pain    Sore throat

## 2016-07-12 NOTE — Discharge Instructions (Signed)
Take your  medications as prescribed. Follow up with your primary care provider for symptoms that are not improving over the week. Return to the ER for symptoms that change or worsen if unable to schedule an appointment.

## 2016-09-20 ENCOUNTER — Emergency Department: Payer: Self-pay

## 2016-09-20 ENCOUNTER — Emergency Department
Admission: EM | Admit: 2016-09-20 | Discharge: 2016-09-20 | Disposition: A | Discharge status: Home / Self Care | Payer: Self-pay | Attending: Emergency Medicine | Admitting: Emergency Medicine

## 2016-09-20 ENCOUNTER — Encounter: Payer: Self-pay | Admitting: Emergency Medicine

## 2016-09-20 DIAGNOSIS — M7551 Bursitis of right shoulder: Secondary | ICD-10-CM | POA: Insufficient documentation

## 2016-09-20 DIAGNOSIS — I10 Essential (primary) hypertension: Secondary | ICD-10-CM | POA: Insufficient documentation

## 2016-09-20 DIAGNOSIS — Z79899 Other long term (current) drug therapy: Secondary | ICD-10-CM | POA: Insufficient documentation

## 2016-09-20 MED ORDER — CYCLOBENZAPRINE HCL 10 MG PO TABS: 10.0000 mg | ORAL_TABLET | Freq: Three times a day (TID) | ORAL | 0 refills | Status: DC | PRN

## 2016-09-20 MED ORDER — CYCLOBENZAPRINE HCL 10 MG PO TABS
10.0000 mg | ORAL_TABLET | Freq: Once | ORAL | Status: AC
  Administered 2016-09-20: 10 mg via ORAL
  Filled 2016-09-20: qty 1

## 2016-09-20 MED ORDER — NAPROXEN 500 MG PO TABS: 500.0000 mg | ORAL_TABLET | Freq: Two times a day (BID) | ORAL | Status: DC

## 2016-09-20 MED ORDER — IBUPROFEN 600 MG PO TABS
600.0000 mg | ORAL_TABLET | Freq: Once | ORAL | Status: AC
  Administered 2016-09-20: 600 mg via ORAL
  Filled 2016-09-20: qty 1

## 2016-09-20 MED ORDER — TRAMADOL HCL 50 MG PO TABS
50.0000 mg | ORAL_TABLET | Freq: Once | ORAL | Status: AC
  Administered 2016-09-20: 50 mg via ORAL
  Filled 2016-09-20: qty 1

## 2016-09-20 MED ORDER — TRAMADOL HCL 50 MG PO TABS: 50.0000 mg | ORAL_TABLET | Freq: Two times a day (BID) | ORAL | 0 refills | Status: DC | PRN

## 2016-09-20 NOTE — ED Triage Notes (Signed)
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  No deformity noted  Positive pulses

## 2016-09-20 NOTE — ED Provider Notes (Signed)
[facility] Emergency Department Provider Note   ____________________________________________   First MD Initiated Contact with Patient 09/20/16 0920     (approximate)  I have reviewed the triage vital signs and the nursing notes.   HISTORY  Chief Complaint Shoulder Pain    HPI Helen Webster is a 41 y.o. female patient complain of right shoulder pain for 3 days. Patient has increased the last 24 hours. 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 Patient is right-hand dominant.His redness pain is 8/10. Patient described a pain as "achy". No palliative measures for this complaint.   Past Medical History:  Diagnosis Date  . Hypertension     There are no active problems to display for this patient.   Past Surgical History:  Procedure Laterality Date  . CESAREAN SECTION      Prior to Admission medications   Medication Sig Start Date End Date Taking? Authorizing Provider  amoxicillin (AMOXIL) 500 MG tablet Take 1 tablet (500 mg total) by mouth 3 (three) times daily. 07/12/16   Chinita Pester, FNP  cyclobenzaprine (FLEXERIL) 10 MG tablet Take 1 tablet (10 mg total) by mouth every 8 (eight) hours as needed for muscle spasms. 08/29/15   Rebecka Apley, MD  cyclobenzaprine (FLEXERIL) 10 MG tablet Take 1 tablet (10 mg total) by mouth 3 (three) times daily as needed. 09/20/16   Joni Reining, PA-C  etodolac (LODINE) 200 MG capsule Take 1 capsule (200 mg total) by mouth every 8 (eight) hours. 08/29/15   Rebecka Apley, MD  guaiFENesin-codeine 100-10 MG/5ML syrup Take 10 mLs by mouth 3 (three) times daily as needed. 07/12/16   Chinita Pester, FNP  naproxen (NAPROSYN) 500 MG tablet Take 1 tablet (500 mg total) by mouth 2 (two) times daily with a meal. 09/20/16   Joni Reining, PA-C  traMADol (ULTRAM) 50 MG tablet Take 1 tablet (50 mg total) by mouth every 12 (twelve) hours as needed. 09/20/16   Joni Reining,  PA-C    Allergies Patient has no known allergies.  No family history on file.  Social History Social History  Substance Use Topics  . Smoking status: Never Smoker  . Smokeless tobacco: Not on file  . Alcohol use No    Review of Systems Constitutional: No fever/chills Eyes: No visual changes. ENT: No sore throat. Cardiovascular: Denies chest pain. Respiratory: Denies shortness of breath. Gastrointestinal: No abdominal pain.  No nausea, no vomiting.  No diarrhea.  No constipation. Genitourinary: Negative for dysuria. Musculoskeletal: Right shoulder pain . Skin: Negative for rash. Neurological: Negative for headaches, focal weakness or numbness. Endocrine:Hypertension ____________________________________________   PHYSICAL EXAM:  VITAL SIGNS: ED Triage Vitals  Enc Vitals Group     BP 09/20/16 0850 (!) 163/109     Pulse Rate 09/20/16 0850 92     Resp 09/20/16 0850 18     Temp 09/20/16 0850 99.2 F (37.3 C)     Temp Source 09/20/16 0850 Oral     SpO2 09/20/16 0850 99 %     Weight 09/20/16 0851 180 lb (81.6 kg)     Height 09/20/16 0851  (1.626 m)     Head Circumference --      Peak Flow --      Pain Score 09/20/16 0849 8     Pain Loc --      Pain Edu? --      Excl. in GC? --  Constitutional: Alert and oriented. Well appearing and in no acute distress. Eyes: Conjunctivae are normal. PERRL. EOMI. Head: Atraumatic. Nose: No congestion/rhinnorhea. Mouth/Throat: Mucous membranes are moist.  Oropharynx non-erythematous. Neck: No stridor.  No cervical spine tenderness to palpation. Hematological/Lymphatic/Immunilogical: No cervical lymphadenopathy. Cardiovascular: Normal rate, regular rhythm. Grossly normal heart sounds.  Good peripheral circulation.Elevated blood pressure Respiratory: Normal respiratory effort.  No retractions. Lungs CTAB. Gastrointestinal: Soft and nontender. No distention. No abdominal bruits. No CVA tenderness. Musculoskeletal: No  obvious deformities to the right shoulder. Patient tender palpation GH joint. Patient also has moderate guarding at the mid medial scapular area. Decreased range of motion's all fields and back complaining of pain.  Neurologic:  Normal speech and language. No gross focal neurologic deficits are appreciated. No gait instability. Skin:  Skin is warm, dry and intact. No rash noted. Psychiatric: Mood and affect are normal. Speech and behavior are normal.  ____________________________________________   LABS (all labs ordered are listed, but only abnormal results are displayed)  Labs Reviewed - No data to display ____________________________________________  EKG   ____________________________________________  RADIOLOGY  Acute findings x-ray of the right shoulder ____________________________________________   PROCEDURES  Procedure(s) performed: None  Procedures  Critical Care performed: No  ____________________________________________   INITIAL IMPRESSION / ASSESSMENT AND PLAN / ED COURSE  Pertinent labs & imaging results that were available during my care of the patient were reviewed by me and considered in my medical decision making (see chart for details).  Right shoulder pain. States rates pending.      ____________________________________________   FINAL CLINICAL IMPRESSION(S) / ED DIAGNOSES  Final diagnoses:  Acute shoulder bursitis, right  Patient given discharge Instructions. Patient is a prescription for Flexeril, naproxen, tramadol. Patient given a work note and advised follow-up family doctor condition persists.    NEW MEDICATIONS STARTED DURING THIS VISIT:  New Prescriptions   CYCLOBENZAPRINE (FLEXERIL) 10 MG TABLET    Take 1 tablet (10 mg total) by mouth 3 (three) times daily as needed.   NAPROXEN (NAPROSYN) 500 MG TABLET    Take 1 tablet (500 mg total) by mouth 2 (two) times daily with a meal.   TRAMADOL (ULTRAM) 50 MG TABLET    Take 1 tablet (50 mg  total) by mouth every 12 (twelve) hours as needed.     Note:  This document was prepared using Dragon voice recognition software and may include unintentional dictation errors.    Joni Reining, PA-C 09/20/16 1036    Emily Filbert, MD 09/20/16 1120

## 2016-12-16 ENCOUNTER — Emergency Department
Admission: EM | Admit: 2016-12-16 | Discharge: 2016-12-16 | Disposition: A | Discharge status: Home / Self Care | Payer: Medicaid Other | Attending: Emergency Medicine | Admitting: Emergency Medicine

## 2016-12-16 ENCOUNTER — Encounter: Payer: Self-pay | Admitting: Emergency Medicine

## 2016-12-16 DIAGNOSIS — Z79899 Other long term (current) drug therapy: Secondary | ICD-10-CM | POA: Insufficient documentation

## 2016-12-16 DIAGNOSIS — M5431 Sciatica, right side: Secondary | ICD-10-CM | POA: Insufficient documentation

## 2016-12-16 DIAGNOSIS — I10 Essential (primary) hypertension: Secondary | ICD-10-CM | POA: Insufficient documentation

## 2016-12-16 DIAGNOSIS — M25511 Pain in right shoulder: Secondary | ICD-10-CM

## 2016-12-16 MED ORDER — PREDNISONE 10 MG PO TABS: ORAL_TABLET | ORAL | 0 refills | Status: DC

## 2016-12-16 MED ORDER — NAPROXEN 500 MG PO TABS: 500.0000 mg | ORAL_TABLET | Freq: Two times a day (BID) | ORAL | 0 refills | Status: DC

## 2016-12-16 MED ORDER — OXYCODONE HCL 5 MG PO TABS
5.0000 mg | ORAL_TABLET | Freq: Once | ORAL | Status: AC
  Administered 2016-12-16: 5 mg via ORAL
  Filled 2016-12-16: qty 1

## 2016-12-16 MED ORDER — METHYLPREDNISOLONE SODIUM SUCC 125 MG IJ SOLR
125.0000 mg | Freq: Once | INTRAMUSCULAR | Status: AC
  Administered 2016-12-16: 125 mg via INTRAMUSCULAR
  Filled 2016-12-16: qty 2

## 2016-12-16 MED ORDER — KETOROLAC TROMETHAMINE 60 MG/2ML IM SOLN
30.0000 mg | Freq: Once | INTRAMUSCULAR | Status: AC
  Administered 2016-12-16: 30 mg via INTRAMUSCULAR
  Filled 2016-12-16: qty 2

## 2016-12-16 NOTE — ED Triage Notes (Signed)
Patient to ER for c/o right shoulder and right hip pain. Has had shoulder checked before and told she had bursitis (pain feels the same per patient). Now having pain to right hip that shoots down leg. 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

## 2016-12-16 NOTE — ED Notes (Signed)

## 2016-12-16 NOTE — ED Notes (Signed)
Urine sent to lab if urinalysis needed.

## 2016-12-16 NOTE — ED Provider Notes (Signed)
[facility] Emergency Department Provider Note  ____________________________________________  Time seen: Approximately 7:48 AM  I have reviewed the triage vital signs and the nursing notes.   HISTORY  Chief Complaint Hip Pain and Shoulder Pain    HPI Helen Webster is a 41 y.o. female that presents to the emergency department with right shoulder pain for 3 months and right hip pain for one week that worsened last night. Patient states that her shoulder pain is in the back of her shoulder. She was told 3 months ago that she has bursitis. X-rays were completed and did not show anything. Shoulder was improving but worsened yesterday. Patient's hip has been hurting at work with walking. She states that she gets a pain in her right buttock that shoots down the back of her leg. Once last night, her legs felt numb. She denies bowel or bladder dysfunction or saddle paresthesias. No trauma. 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 She denies fever, shortness of breath, chest pain, nausea, vomiting, abdominal pain, back pain, dysuria, urgency, frequency, or tingling.   Past Medical History:  Diagnosis Date  . Hypertension     There are no active problems to display for this patient.   Past Surgical History:  Procedure Laterality Date  . CESAREAN SECTION      Prior to Admission medications   Medication Sig Start Date End Date Taking? Authorizing Provider  amoxicillin (AMOXIL) 500 MG tablet Take 1 tablet (500 mg total) by mouth 3 (three) times daily. 07/12/16   Triplett, Rulon Eisenmenger B, FNP  cyclobenzaprine (FLEXERIL) 10 MG tablet Take 1 tablet (10 mg total) by mouth every 8 (eight) hours as needed for muscle spasms. 08/29/15   Rebecka Apley, MD  cyclobenzaprine (FLEXERIL) 10 MG tablet Take 1 tablet (10 mg total) by mouth 3 (three) times daily as needed. 09/20/16   Joni Reining, PA-C  etodolac (LODINE) 200 MG capsule Take 1 capsule (200 mg  total) by mouth every 8 (eight) hours. 08/29/15   Rebecka Apley, MD  guaiFENesin-codeine 100-10 MG/5ML syrup Take 10 mLs by mouth 3 (three) times daily as needed. 07/12/16   Triplett, Rulon Eisenmenger B, FNP  naproxen (NAPROSYN) 500 MG tablet Take 1 tablet (500 mg total) by mouth 2 (two) times daily with a meal. 12/16/16 12/16/17  Enid Derry, PA-C  predniSONE (DELTASONE) 10 MG tablet Take 6 tablets on day 1, take 5 tablets on day 2, take 4 tablets on day 3, take 3 tablets on day 4, take 2 tablets on day 5, take 1 tablet on day 6 12/16/16   Enid Derry, PA-C  traMADol (ULTRAM) 50 MG tablet Take 1 tablet (50 mg total) by mouth every 12 (twelve) hours as needed. 09/20/16   Joni Reining, PA-C    Allergies Patient has no known allergies.  No family history on file.  Social History Social History  Substance Use Topics  . Smoking status: Never Smoker  . Smokeless tobacco: Never Used  . Alcohol use No     Review of Systems  Constitutional: No fever/chills Cardiovascular: No chest pain. Respiratory: No SOB. Gastrointestinal: No abdominal pain.  No nausea, no vomiting.  Genitourinary: Negative for dysuria. Skin: Negative for rash, abrasions, lacerations, ecchymosis. Neurological: Negative for headaches   ____________________________________________   PHYSICAL EXAM:  VITAL SIGNS: ED Triage Vitals  Enc Vitals Group     BP 12/16/16 0731 (!) 174/106     Pulse Rate 12/16/16 0731 100  Resp 12/16/16 0731 18     Temp 12/16/16 0731 98.8 F (37.1 C)     Temp Source 12/16/16 0731 Oral     SpO2 12/16/16 0731 97 %     Weight 12/16/16 0732 175 lb (79.4 kg)     Height 12/16/16 0732 5\' 4"  (1.626 m)     Head Circumference --      Peak Flow --      Pain Score 12/16/16 0731 10     Pain Loc --      Pain Edu? --      Excl. in GC? --      Constitutional: Alert and oriented. Well appearing and in no acute distress. Eyes: Conjunctivae are normal. PERRL. EOMI. Head: Atraumatic. ENT:       Ears:      Nose: No congestion/rhinnorhea.      Mouth/Throat: Mucous membranes are moist.  Neck: No stridor.   Cardiovascular: Normal rate, regular rhythm.  Good peripheral circulation. Respiratory: Normal respiratory effort without tachypnea or retractions. Lungs CTAB. Good air entry to the bases with no decreased or absent breath sounds. Gastrointestinal: Bowel sounds 4 quadrants. Soft and nontender to palpation. No guarding or rigidity. No palpable masses. No distention.  Musculoskeletal: Full range of motion to all extremities. No gross deformities appreciated. Pinpoint tenderness to palpation in upper right back between shoulder and neck. Tenderness to palpation over right hip bursa. No lumbar or SI tenderness. Positive straight leg raise. Neurologic:  Normal speech and language. No gross focal neurologic deficits are appreciated.  Skin:  Skin is warm, dry and intact. No rash noted.   ____________________________________________   LABS (all labs ordered are listed, but only abnormal results are displayed)  Labs Reviewed - No data to display ____________________________________________  EKG   ____________________________________________  RADIOLOGY  No results found.  ____________________________________________    PROCEDURES  Procedure(s) performed:    Procedures    Medications  methylPREDNISolone sodium succinate (SOLU-MEDROL) 125 mg/2 mL injection 125 mg (125 mg Intramuscular Given 12/16/16 0754)  ketorolac (TORADOL) injection 30 mg (30 mg Intramuscular Given 12/16/16 0754)  oxyCODONE (Oxy IR/ROXICODONE) immediate release tablet 5 mg (5 mg Oral Given 12/16/16 0823)     ____________________________________________   INITIAL IMPRESSION / ASSESSMENT AND PLAN / ED COURSE  Pertinent labs & imaging results that were available during my care of the patient were reviewed by me and considered in my medical decision making (see chart for details).  Review of the [place] CSRS  was performed in accordance of the [facility] prior to dispensing any controlled drugs.   Patient presented to the emergency department for evaluation of shoulder pain and hip pain. Vital signs and exam are reassuring. Shoulder pain is likely musculoskeletal. She has been told in the past she has bursitis. Patient has had previous shoulder x-rays completed, which did not show any bony abnormalities. Hip pain is consistent with sciatica. No bowel or bladder dysfunction or saddle paresthesias. She was given Solu-Medrol and Toradol in ED. Patient will be discharged home with prescriptions for prednisone and ibuprofen.. Patient is to follow up with PCP as directed. Patient is given ED precautions to return to the ED for any worsening or new symptoms.     ____________________________________________  FINAL CLINICAL IMPRESSION(S) / ED DIAGNOSES  Final diagnoses:  Sciatica of right side  Right shoulder pain, unspecified chronicity      NEW MEDICATIONS STARTED DURING THIS VISIT:  Discharge Medication List as of 12/16/2016  7:59 AM  START taking these medications   Details  predniSONE (DELTASONE) 10 MG tablet Take 6 tablets on day 1, take 5 tablets on day 2, take 4 tablets on day 3, take 3 tablets on day 4, take 2 tablets on day 5, take 1 tablet on day 6, Print            This chart was dictated using voice recognition software/Dragon. Despite best efforts to proofread, errors can occur which can change the meaning. Any change was purely unintentional.    Enid Derry, PA-C 12/16/16 1305    Don Perking, [place], MD 12/17/16 [phone]

## 2016-12-16 NOTE — ED Notes (Signed)
See triage note. Pt worked as [employer] last night. C/o pain to R shoulder (hx bursitis same site) and R hip that shoots down to foot.

## 2017-02-09 ENCOUNTER — Emergency Department: Payer: Self-pay

## 2017-02-09 ENCOUNTER — Emergency Department
Admission: EM | Admit: 2017-02-09 | Discharge: 2017-02-09 | Disposition: A | Discharge status: Home / Self Care | Payer: Self-pay | Attending: Emergency Medicine | Admitting: Emergency Medicine

## 2017-02-09 ENCOUNTER — Encounter: Payer: Self-pay | Admitting: Emergency Medicine

## 2017-02-09 DIAGNOSIS — R739 Hyperglycemia, unspecified: Secondary | ICD-10-CM | POA: Insufficient documentation

## 2017-02-09 DIAGNOSIS — R55 Syncope and collapse: Secondary | ICD-10-CM | POA: Insufficient documentation

## 2017-02-09 DIAGNOSIS — G44201 Tension-type headache, unspecified, intractable: Secondary | ICD-10-CM | POA: Insufficient documentation

## 2017-02-09 DIAGNOSIS — R531 Weakness: Secondary | ICD-10-CM | POA: Insufficient documentation

## 2017-02-09 DIAGNOSIS — I10 Essential (primary) hypertension: Secondary | ICD-10-CM | POA: Insufficient documentation

## 2017-02-09 DIAGNOSIS — R51 Headache: Secondary | ICD-10-CM

## 2017-02-09 DIAGNOSIS — R519 Headache, unspecified: Secondary | ICD-10-CM

## 2017-02-09 LAB — BASIC METABOLIC PANEL
Anion gap: 9 (ref 5–15)
BUN: 6 mg/dL (ref 6–20)
CO2: 24 mmol/L (ref 22–32)
Calcium: 8.6 mg/dL — ABNORMAL LOW (ref 8.9–10.3)
Chloride: 98 mmol/L — ABNORMAL LOW (ref 101–111)
Creatinine, Ser: 0.6 mg/dL (ref 0.44–1.00)
GFR calc Af Amer: >60 mL/min (ref >60)
GFR calc non Af Amer: >60 mL/min (ref >60)
Glucose, Bld: 395 mg/dL — ABNORMAL HIGH (ref 65–99)
Potassium: 3.7 mmol/L (ref 3.5–5.1)
Sodium: 131 mmol/L — ABNORMAL LOW (ref 135–145)

## 2017-02-09 LAB — URINALYSIS, COMPLETE (UACMP) WITH MICROSCOPIC
Bacteria, UA: NONE SEEN
Bilirubin Urine: NEGATIVE
Glucose, UA: >=500 mg/dL — AB
Hgb urine dipstick: NEGATIVE
Ketones, ur: NEGATIVE mg/dL
Leukocytes, UA: NEGATIVE
Nitrite: NEGATIVE
Protein, ur: 100 mg/dL — AB
Specific Gravity, Urine: 1.03 (ref 1.005–1.030)
pH: 6 (ref 5.0–8.0)

## 2017-02-09 LAB — CBC
HCT: 38.1 % (ref 35.0–47.0)
Hemoglobin: 13.4 g/dL (ref 12.0–16.0)
MCH: 30.7 pg (ref 26.0–34.0)
MCHC: 35.2 g/dL (ref 32.0–36.0)
MCV: 87.1 fL (ref 80.0–100.0)
Platelets: 205 10*3/uL (ref 150–440)
RBC: 4.37 MIL/uL (ref 3.80–5.20)
RDW: 13.1 % (ref 11.5–14.5)
WBC: 11 10*3/uL (ref 3.6–11.0)

## 2017-02-09 LAB — HEPATIC FUNCTION PANEL
ALT: 9 U/L — ABNORMAL LOW (ref 14–54)
AST: 14 U/L — ABNORMAL LOW (ref 15–41)
Albumin: 3.9 g/dL (ref 3.5–5.0)
Alkaline Phosphatase: 89 U/L (ref 38–126)
Bilirubin, Direct: <0.1 mg/dL — ABNORMAL LOW (ref 0.1–0.5)
Total Bilirubin: 0.6 mg/dL (ref 0.3–1.2)
Total Protein: 7.5 g/dL (ref 6.5–8.1)

## 2017-02-09 LAB — LIPASE, BLOOD: Lipase: 33 U/L (ref 11–51)

## 2017-02-09 LAB — GLUCOSE, CAPILLARY
Glucose-Capillary: 384 mg/dL — ABNORMAL HIGH (ref 65–99)
Glucose-Capillary: 314 mg/dL — ABNORMAL HIGH (ref 65–99)
Glucose-Capillary: 301 mg/dL — ABNORMAL HIGH (ref 65–99)
Glucose-Capillary: 291 mg/dL — ABNORMAL HIGH (ref 65–99)
Glucose-Capillary: 265 mg/dL — ABNORMAL HIGH (ref 65–99)

## 2017-02-09 LAB — TROPONIN I: Troponin I: <0.03 ng/mL (ref <0.03)

## 2017-02-09 LAB — POC URINE PREG, ED: Preg Test, Ur: NEGATIVE

## 2017-02-09 MED ORDER — INSULIN ASPART 100 UNIT/ML ~~LOC~~ SOLN
6.0000 [IU] | Freq: Once | SUBCUTANEOUS | Status: AC
  Administered 2017-02-09: 6 [IU] via SUBCUTANEOUS
  Filled 2017-02-09: qty 1

## 2017-02-09 MED ORDER — SODIUM CHLORIDE 0.9 % IV BOLUS (SEPSIS)
1000.0000 mL | Freq: Once | INTRAVENOUS | Status: AC
  Administered 2017-02-09: 1000 mL via INTRAVENOUS

## 2017-02-09 MED ORDER — METOCLOPRAMIDE HCL 5 MG/ML IJ SOLN
10.0000 mg | Freq: Once | INTRAMUSCULAR | Status: AC
  Administered 2017-02-09: 10 mg via INTRAVENOUS
  Filled 2017-02-09: qty 2

## 2017-02-09 MED ORDER — BUTALBITAL-APAP-CAFFEINE 50-325-40 MG PO TABS: 1.0000 | ORAL_TABLET | Freq: Four times a day (QID) | ORAL | 0 refills | Status: DC | PRN

## 2017-02-09 MED ORDER — DIPHENHYDRAMINE HCL 50 MG/ML IJ SOLN
25.0000 mg | Freq: Once | INTRAMUSCULAR | Status: AC
  Administered 2017-02-09: 25 mg via INTRAVENOUS
  Filled 2017-02-09: qty 1

## 2017-02-09 MED ORDER — ONDANSETRON 4 MG PO TBDP: 4.0000 mg | ORAL_TABLET | Freq: Three times a day (TID) | ORAL | 0 refills | Status: DC | PRN

## 2017-02-09 MED ORDER — BUTALBITAL-APAP-CAFFEINE 50-325-40 MG PO TABS
2.0000 | ORAL_TABLET | Freq: Once | ORAL | Status: AC
  Administered 2017-02-09: 2 via ORAL
  Filled 2017-02-09: qty 2

## 2017-02-09 MED ORDER — MAGNESIUM SULFATE 2 GM/50ML IV SOLN
2.0000 g | Freq: Once | INTRAVENOUS | Status: AC
  Administered 2017-02-09: 2 g via INTRAVENOUS
  Filled 2017-02-09: qty 50

## 2017-02-09 MED ORDER — METFORMIN HCL 500 MG PO TABS: 500.0000 mg | ORAL_TABLET | Freq: Two times a day (BID) | ORAL | 0 refills | Status: DC

## 2017-02-09 MED ORDER — KETOROLAC TROMETHAMINE 30 MG/ML IJ SOLN
30.0000 mg | Freq: Once | INTRAMUSCULAR | Status: AC
  Administered 2017-02-09: 30 mg via INTRAVENOUS
  Filled 2017-02-09: qty 1

## 2017-02-09 NOTE — ED Notes (Signed)
BG 314

## 2017-02-09 NOTE — Discharge Instructions (Signed)
Please follow-up with your primary care physician. Please also follow-up with neurology. She to have any return of her symptoms worsened symptoms or any other complaints please return to the emergency department for immediate evaluation. We did discuss lumbar puncture during this visit and the decision was made not to perform a lumbar puncture. She to return a lumbar puncture may be indicated.

## 2017-02-09 NOTE — ED Notes (Signed)
BG: 291

## 2017-02-09 NOTE — ED Triage Notes (Addendum)
Pt to rm 7 via EMS from work at [employer], report near syncopal episode, hypertensive and hyperglycemic upon assessment, BG 496, pt w/o hx of DM, pt denies taking any medications.  Pt report HA, pt not speaking much.  Pt report vomiting x 2 and feeling very weak

## 2017-02-09 NOTE — ED Provider Notes (Signed)
[facility] Emergency Department Provider Note   ____________________________________________   First MD Initiated Contact with Patient 02/09/17 0133     (approximate)  I have reviewed the triage vital signs and the nursing notes.   HISTORY  Chief Complaint Near Syncope and Hyperglycemia    HPI Helen Webster is a 41 y.o. female who comes into the hospital today with a headache. She reports that her head started hurting a couple of hours prior to her coming in. She said that she woke up on the floor. She assumes that she passed out. She reports that she took some Excedrin but does not have a history of migraines or headaches. The patient has had nausea and vomiting. She reports it was nonbloody and nonbilious. The patient states that she's been dizzy and lightheaded in all started this evening. The patient denies any chest pain or shortness of breath. She reports that she has 10 out of 10 pain in her head. She denies any belly pain or problems with urination. The patient is here for evaluation.Per EMS the patient's blood sugar is over 500   Past Medical History:  Diagnosis Date  . Hypertension     There are no active problems to display for this patient.   Past Surgical History:  Procedure Laterality Date  . CESAREAN SECTION      Prior to Admission medications   Medication Sig Start Date End Date Taking? Authorizing Provider  amoxicillin (AMOXIL) 500 MG tablet Take 1 tablet (500 mg total) by mouth 3 (three) times daily. 07/12/16   Chinita Pesterriplett, Cari B, FNP  butalbital-acetaminophen-caffeine (FIORICET, ESGIC) [phone] MG tablet Take 1-2 tablets by mouth every 6 (six) hours as needed for headache. 02/09/17 02/09/18  Rebecka ApleyWebster, Anushree Dorsi P, MD  cyclobenzaprine (FLEXERIL) 10 MG tablet Take 1 tablet (10 mg total) by mouth every 8 (eight) hours as needed for muscle spasms. 08/29/15   Rebecka ApleyWebster, Ottavio Norem P, MD  cyclobenzaprine (FLEXERIL) 10 MG tablet Take 1 tablet (10 mg  total) by mouth 3 (three) times daily as needed. 09/20/16   Joni ReiningSmith, Ronald K, PA-C  etodolac (LODINE) 200 MG capsule Take 1 capsule (200 mg total) by mouth every 8 (eight) hours. 08/29/15   Rebecka ApleyWebster, Dunbar Buras P, MD  guaiFENesin-codeine 100-10 MG/5ML syrup Take 10 mLs by mouth 3 (three) times daily as needed. 07/12/16   Kem Boroughsriplett, Cari B, FNP  metFORMIN (GLUCOPHAGE) 500 MG tablet Take 1 tablet (500 mg total) by mouth 2 (two) times daily with a meal. 02/09/17 02/09/18  Rebecka ApleyWebster, Noha Karasik P, MD  naproxen (NAPROSYN) 500 MG tablet Take 1 tablet (500 mg total) by mouth 2 (two) times daily with a meal. 12/16/16 12/16/17  Enid DerryWagner, Ashley, PA-C  ondansetron (ZOFRAN ODT) 4 MG disintegrating tablet Take 1 tablet (4 mg total) by mouth every 8 (eight) hours as needed for nausea or vomiting. 02/09/17   Rebecka ApleyWebster, Fabienne Nolasco P, MD  predniSONE (DELTASONE) 10 MG tablet Take 6 tablets on day 1, take 5 tablets on day 2, take 4 tablets on day 3, take 3 tablets on day 4, take 2 tablets on day 5, take 1 tablet on day 6 12/16/16   Enid DerryWagner, Ashley, PA-C  traMADol (ULTRAM) 50 MG tablet Take 1 tablet (50 mg total) by mouth every 12 (twelve) hours as needed. 09/20/16   Joni ReiningSmith, Ronald K, PA-C    Allergies Patient has no known allergies.  History reviewed. No pertinent family history.  Social History Social History  Substance Use Topics  . Smoking status: Never  Smoker  . Smokeless tobacco: Never Used  . Alcohol use No    Review of Systems  Constitutional: No fever/chills Eyes: No visual changes. ENT: No sore throat. Cardiovascular: Denies chest pain. Respiratory: Denies shortness of breath. Gastrointestinal: Nausea and vomiting, No abdominal pain. No diarrhea.  No constipation. Genitourinary: Negative for dysuria. Musculoskeletal: Negative for back pain. Skin: Negative for rash. Neurological: Headache and dizziness   ____________________________________________   PHYSICAL EXAM:  VITAL SIGNS: ED Triage Vitals  Enc Vitals Group       BP 02/09/17 0133 (!) 166/102     Pulse Rate 02/09/17 0133 91     Resp 02/09/17 0133 (!) 23     Temp 02/09/17 0133 98.2 F (36.8 C)     Temp Source 02/09/17 0133 Oral     SpO2 02/09/17 0133 100 %     Weight 02/09/17 0129 175 lb (79.4 kg)     Height 02/09/17 0129 5\' 4"  (1.626 m)     Head Circumference --      Peak Flow --      Pain Score 02/09/17 0127 10     Pain Loc --      Pain Edu? --      Excl. in GC? --     Constitutional: Alert and oriented. Well appearing and in Moderate distress. Eyes: Conjunctivae are normal. PERRL. EOMI. Head: Atraumatic. Nose: No congestion/rhinnorhea. Mouth/Throat: Mucous membranes are moist.  Oropharynx non-erythematous. Cardiovascular: Normal rate, regular rhythm. Grossly normal heart sounds.  Good peripheral circulation. Respiratory: Normal respiratory effort.  No retractions. Lungs CTAB. Gastrointestinal: Soft and nontender. No distention. Positive bowel sounds Musculoskeletal: No lower extremity tenderness nor edema.   Neurologic:  Normal speech and language. Cranial nerves II through XII are grossly intact patient has some generalized weakness but it is symmetrical. She is approximately a 4/5 strength in her upper and lower extremities. Sensation intact throughout.. Skin:  Skin is warm, dry and intact.  Psychiatric: Mood and affect are normal.   ____________________________________________   LABS (all labs ordered are listed, but only abnormal results are displayed)  Labs Reviewed  BASIC METABOLIC PANEL - Abnormal; Notable for the following:       Result Value   Sodium 131 (*)    Chloride 98 (*)    Glucose, Bld 395 (*)    Calcium 8.6 (*)    All other components within normal limits  URINALYSIS, COMPLETE (UACMP) WITH MICROSCOPIC - Abnormal; Notable for the following:    Color, Urine STRAW (*)    APPearance CLEAR (*)    Glucose, UA >=500 (*)    Protein, ur 100 (*)    Squamous Epithelial / LPF 0-5 (*)    All other components within  normal limits  GLUCOSE, CAPILLARY - Abnormal; Notable for the following:    Glucose-Capillary 384 (*)    All other components within normal limits  HEPATIC FUNCTION PANEL - Abnormal; Notable for the following:    AST 14 (*)    ALT 9 (*)    Bilirubin, Direct <0.1 (*)    All other components within normal limits  GLUCOSE, CAPILLARY - Abnormal; Notable for the following:    Glucose-Capillary 314 (*)    All other components within normal limits  GLUCOSE, CAPILLARY - Abnormal; Notable for the following:    Glucose-Capillary 301 (*)    All other components within normal limits  GLUCOSE, CAPILLARY - Abnormal; Notable for the following:    Glucose-Capillary 291 (*)    All other components  within normal limits  GLUCOSE, CAPILLARY - Abnormal; Notable for the following:    Glucose-Capillary 265 (*)    All other components within normal limits  CBC  TROPONIN I  BLOOD GAS, VENOUS  LIPASE, BLOOD  CBG MONITORING, ED  POC URINE PREG, ED   ____________________________________________  EKG  ED ECG REPORT I, Rebecka Apley, the attending physician, personally viewed and interpreted this ECG.   Date: 02/09/2017  EKG Time: 137  Rate: 87  Rhythm: normal sinus rhythm  Axis: normal  Intervals:none  ST&T Change: none  ____________________________________________  RADIOLOGY  Ct Head Wo Contrast  Result Date: 02/09/2017 CLINICAL DATA:  Syncopal episode EXAM: CT HEAD WITHOUT CONTRAST TECHNIQUE: Contiguous axial images were obtained from the base of the skull through the vertex without intravenous contrast. COMPARISON:  12/18/2012 FINDINGS: Brain: No evidence of acute infarction, hemorrhage, hydrocephalus, extra-axial collection or mass lesion/mass effect. Vascular: No hyperdense vessel or unexpected calcification. Skull: Normal. Negative for fracture or focal lesion. Sinuses/Orbits: No acute finding. Other: None IMPRESSION: No CT evidence for acute intracranial abnormality. Electronically  Signed   By: Jasmine Pang M.D.   On: 02/09/2017 02:19    ____________________________________________   PROCEDURES  Procedure(s) performed: None  Procedures  Critical Care performed: No  ____________________________________________   INITIAL IMPRESSION / ASSESSMENT AND PLAN / ED COURSE  Pertinent labs & imaging results that were available during my care of the patient were reviewed by me and considered in my medical decision making (see chart for details).  This is a 41 year old female who comes into the hospital today with a headache, syncope and dizziness. We did send the patient for CT scan of her brain as well as some blood work. The patient's blood sugar was 395 and her head CT was unremarkable. I did give the patient some Reglan and Benadryl with a liter of normal saline initially. She reports that the headache did improve but she still had some. I then gave her some magnesium, Toradol and Fioricet. The patient then received a second liter of normal saline and 6 units of insulin. Her blood sugar came down to 265 and her headache is much improved. I discussed with the patient lumbar puncture to further evaluate for intracranial hemorrhage. I informed her that if her headache is still so severe that that would be the next step to evaluate for possible aneurysmal bleed. The patient reports that she doesn't think she needs that at this time. She reports that she feels comfortable going home and following up with her primary care physician. I discussed with the patient as well that she should return to the hospital if she has any blurred vision numbness tingling worsened headache worsen vomiting or any other complaints or concerns. I feel that at that point she will need to receive a lumbar puncture. Otherwise the patient be discharged home to follow-up. The patient has no further complaints or concerns.      ____________________________________________   FINAL CLINICAL IMPRESSION(S)  / ED DIAGNOSES  Final diagnoses:  Acute nonintractable headache, unspecified headache type  Syncope, unspecified syncope type  Weakness  Hyperglycemia      NEW MEDICATIONS STARTED DURING THIS VISIT:  New Prescriptions   BUTALBITAL-ACETAMINOPHEN-CAFFEINE (FIORICET, ESGIC) 50-325-40 MG TABLET    Take 1-2 tablets by mouth every 6 (six) hours as needed for headache.   METFORMIN (GLUCOPHAGE) 500 MG TABLET    Take 1 tablet (500 mg total) by mouth 2 (two) times daily with a meal.   ONDANSETRON (  ZOFRAN ODT) 4 MG DISINTEGRATING TABLET    Take 1 tablet (4 mg total) by mouth every 8 (eight) hours as needed for nausea or vomiting.     Note:  This document was prepared using Dragon voice recognition software and may include unintentional dictation errors.    Rebecka Apley, MD 02/09/17 [phone]

## 2017-02-09 NOTE — ED Notes (Signed)
Dr. Webster at bedside.  

## 2017-02-09 NOTE — ED Notes (Signed)
Pt ambulatory to toilet with this nurse at standby

## 2017-02-09 NOTE — ED Notes (Signed)
BG 384

## 2017-02-09 NOTE — ED Notes (Signed)
Pt states unable to give urine sample at this time 

## 2017-02-13 LAB — BLOOD GAS, VENOUS
Acid-Base Excess: 0.5 mmol/L (ref 0.0–2.0)
Bicarbonate: 26 mmol/L (ref 20.0–28.0)
FIO2: 0.21
Patient temperature: 37
pCO2, Ven: 44 mmHg (ref 44.0–60.0)
pH, Ven: 7.38 (ref 7.250–7.430)

## 2017-02-21 ENCOUNTER — Emergency Department
Admission: EM | Admit: 2017-02-21 | Discharge: 2017-02-21 | Disposition: A | Discharge status: Home / Self Care | Payer: Self-pay | Attending: Emergency Medicine | Admitting: Emergency Medicine

## 2017-02-21 ENCOUNTER — Encounter: Payer: Self-pay | Admitting: Emergency Medicine

## 2017-02-21 DIAGNOSIS — H53149 Visual discomfort, unspecified: Secondary | ICD-10-CM | POA: Insufficient documentation

## 2017-02-21 DIAGNOSIS — R110 Nausea: Secondary | ICD-10-CM | POA: Insufficient documentation

## 2017-02-21 DIAGNOSIS — E119 Type 2 diabetes mellitus without complications: Secondary | ICD-10-CM | POA: Insufficient documentation

## 2017-02-21 DIAGNOSIS — G43909 Migraine, unspecified, not intractable, without status migrainosus: Secondary | ICD-10-CM | POA: Insufficient documentation

## 2017-02-21 DIAGNOSIS — G43009 Migraine without aura, not intractable, without status migrainosus: Secondary | ICD-10-CM

## 2017-02-21 DIAGNOSIS — E0800 Diabetes mellitus due to underlying condition with hyperosmolarity without nonketotic hyperglycemic-hyperosmolar coma (NKHHC): Secondary | ICD-10-CM

## 2017-02-21 DIAGNOSIS — Z7984 Long term (current) use of oral hypoglycemic drugs: Secondary | ICD-10-CM | POA: Insufficient documentation

## 2017-02-21 DIAGNOSIS — Z79899 Other long term (current) drug therapy: Secondary | ICD-10-CM | POA: Insufficient documentation

## 2017-02-21 DIAGNOSIS — I10 Essential (primary) hypertension: Secondary | ICD-10-CM | POA: Insufficient documentation

## 2017-02-21 LAB — CBC WITH DIFFERENTIAL/PLATELET
Basophils Absolute: 0.1 10*3/uL (ref 0–0.1)
Basophils Relative: 1 %
Eosinophils Absolute: 0 10*3/uL (ref 0–0.7)
Eosinophils Relative: 1 %
HCT: 37.3 % (ref 35.0–47.0)
Hemoglobin: 13.1 g/dL (ref 12.0–16.0)
Lymphocytes Relative: 25 %
Lymphs Abs: 2 10*3/uL (ref 1.0–3.6)
MCH: 30.1 pg (ref 26.0–34.0)
MCHC: 35 g/dL (ref 32.0–36.0)
MCV: 86 fL (ref 80.0–100.0)
Monocytes Absolute: 0.5 10*3/uL (ref 0.2–0.9)
Monocytes Relative: 6 %
Neutro Abs: 5.6 10*3/uL (ref 1.4–6.5)
Neutrophils Relative %: 67 %
Platelets: 240 10*3/uL (ref 150–440)
RBC: 4.34 MIL/uL (ref 3.80–5.20)
RDW: 12.9 % (ref 11.5–14.5)
WBC: 8.2 10*3/uL (ref 3.6–11.0)

## 2017-02-21 LAB — URINALYSIS, COMPLETE (UACMP) WITH MICROSCOPIC
Bilirubin Urine: NEGATIVE
Glucose, UA: NEGATIVE mg/dL
Hgb urine dipstick: NEGATIVE
Ketones, ur: NEGATIVE mg/dL
Leukocytes, UA: NEGATIVE
Nitrite: NEGATIVE
Protein, ur: NEGATIVE mg/dL
Specific Gravity, Urine: 1.016 (ref 1.005–1.030)
pH: 6 (ref 5.0–8.0)

## 2017-02-21 LAB — BASIC METABOLIC PANEL
Anion gap: 10 (ref 5–15)
BUN: 6 mg/dL (ref 6–20)
CO2: 24 mmol/L (ref 22–32)
Calcium: 9.2 mg/dL (ref 8.9–10.3)
Chloride: 104 mmol/L (ref 101–111)
Creatinine, Ser: 0.59 mg/dL (ref 0.44–1.00)
GFR calc Af Amer: >60 mL/min (ref >60)
GFR calc non Af Amer: >60 mL/min (ref >60)
Glucose, Bld: 212 mg/dL — ABNORMAL HIGH (ref 65–99)
Potassium: 3.9 mmol/L (ref 3.5–5.1)
Sodium: 138 mmol/L (ref 135–145)

## 2017-02-21 MED ORDER — SODIUM CHLORIDE 0.9 % IV BOLUS (SEPSIS)
1000.0000 mL | Freq: Once | INTRAVENOUS | Status: AC
  Administered 2017-02-21: 1000 mL via INTRAVENOUS

## 2017-02-21 MED ORDER — METOCLOPRAMIDE HCL 5 MG/ML IJ SOLN
10.0000 mg | Freq: Once | INTRAMUSCULAR | Status: AC
  Administered 2017-02-21: 10 mg via INTRAVENOUS
  Filled 2017-02-21: qty 2

## 2017-02-21 MED ORDER — BUTALBITAL-APAP-CAFFEINE 50-325-40 MG PO TABS: 1.0000 | ORAL_TABLET | Freq: Four times a day (QID) | ORAL | 0 refills | Status: DC | PRN

## 2017-02-21 MED ORDER — KETOROLAC TROMETHAMINE 30 MG/ML IJ SOLN
30.0000 mg | Freq: Once | INTRAMUSCULAR | Status: AC
  Administered 2017-02-21: 30 mg via INTRAVENOUS
  Filled 2017-02-21: qty 1

## 2017-02-21 MED ORDER — MAGNESIUM SULFATE 2 GM/50ML IV SOLN: 2.0000 g | Freq: Once | INTRAVENOUS | Status: DC

## 2017-02-21 MED ORDER — DIPHENHYDRAMINE HCL 50 MG/ML IJ SOLN
25.0000 mg | Freq: Once | INTRAMUSCULAR | Status: AC
  Administered 2017-02-21: 25 mg via INTRAVENOUS
  Filled 2017-02-21: qty 1

## 2017-02-21 NOTE — ED Provider Notes (Signed)
[facility] Emergency Department Provider Note   ____________________________________________   First MD Initiated Contact with Patient 02/21/17 [phone]     (approximate)  I have reviewed the triage vital signs and the nursing notes.   HISTORY  Chief Complaint Migraine    HPI Helen Webster is a 41 y.o. female patient presents with headache, nausea, and light sensitivity. Patient also states vision is blurry secondary to the headaches. Patient recently diagnosed with migraine headache and diabetes on a ER visit 2 weeks ago. Patient given extensive workup to include CT of the head and labs. Patient given a prescription for metformin and Fioricet on her visit 2 weeks ago. Patient has not followed up with [place] primary care as directed secondary to lack of insurance. Patient states medication for migraine headaches. Patient states she's taking metformin as directed.   Past Medical History:  Diagnosis Date  . Hypertension     There are no active problems to display for this patient.   Past Surgical History:  Procedure Laterality Date  . CESAREAN SECTION      Prior to Admission medications   Medication Sig Start Date End Date Taking? Authorizing Provider  amoxicillin (AMOXIL) 500 MG tablet Take 1 tablet (500 mg total) by mouth 3 (three) times daily. 07/12/16   Chinita Pester, FNP  butalbital-acetaminophen-caffeine (FIORICET, ESGIC) [phone] MG tablet Take 1-2 tablets by mouth every 6 (six) hours as needed for headache. 02/21/17 02/21/18  Joni Reining, PA-C  cyclobenzaprine (FLEXERIL) 10 MG tablet Take 1 tablet (10 mg total) by mouth every 8 (eight) hours as needed for muscle spasms. 08/29/15   Rebecka Apley, MD  cyclobenzaprine (FLEXERIL) 10 MG tablet Take 1 tablet (10 mg total) by mouth 3 (three) times daily as needed. 09/20/16   Joni Reining, PA-C  etodolac (LODINE) 200 MG capsule Take 1 capsule (200 mg total) by mouth every 8 (eight) hours. 08/29/15    Rebecka Apley, MD  guaiFENesin-codeine 100-10 MG/5ML syrup Take 10 mLs by mouth 3 (three) times daily as needed. 07/12/16   Kem Boroughs B, FNP  metFORMIN (GLUCOPHAGE) 500 MG tablet Take 1 tablet (500 mg total) by mouth 2 (two) times daily with a meal. 02/09/17 02/09/18  Rebecka Apley, MD  naproxen (NAPROSYN) 500 MG tablet Take 1 tablet (500 mg total) by mouth 2 (two) times daily with a meal. 12/16/16 12/16/17  Enid Derry, PA-C  ondansetron (ZOFRAN ODT) 4 MG disintegrating tablet Take 1 tablet (4 mg total) by mouth every 8 (eight) hours as needed for nausea or vomiting. 02/09/17   Rebecka Apley, MD  predniSONE (DELTASONE) 10 MG tablet Take 6 tablets on day 1, take 5 tablets on day 2, take 4 tablets on day 3, take 3 tablets on day 4, take 2 tablets on day 5, take 1 tablet on day 6 12/16/16   Enid Derry, PA-C  traMADol (ULTRAM) 50 MG tablet Take 1 tablet (50 mg total) by mouth every 12 (twelve) hours as needed. 09/20/16   Joni Reining, PA-C    Allergies Patient has no known allergies.  No family history on file.  Social History Social History  Substance Use Topics  . Smoking status: Never Smoker  . Smokeless tobacco: Never Used  . Alcohol use No    Review of Systems Constitutional: No fever/chills Eyes: Light sensitivity ENT: No sore throat. Cardiovascular: Denies chest pain. Respiratory: Denies shortness of breath. Gastrointestinal: No abdominal pain.  No nausea, no vomiting.  No diarrhea.  No constipation. Genitourinary: Negative for dysuria. Musculoskeletal: Negative for back pain. Skin: Negative for rash. Neurological: Positive for headaches, but denies focal weakness or numbness. Endocrine:New-onset diabetes  ____________________________________________   PHYSICAL EXAM:  VITAL SIGNS: ED Triage Vitals  Enc Vitals Group     BP 02/21/17 0837 (!) 144/92     Pulse Rate 02/21/17 0837 80     Resp 02/21/17 0837 18     Temp 02/21/17 0837 99.3 F (37.4 C)      Temp Source 02/21/17 0837 Oral     SpO2 02/21/17 0837 100 %     Weight 02/21/17 0845 185 lb (83.9 kg)     Height 02/21/17 0845 5\' 4"  (1.626 m)     Head Circumference --      Peak Flow --      Pain Score --      Pain Loc --      Pain Edu? --      Excl. in GC? --     Constitutional: Alert and oriented. Well appearing and in no acute distress. Eyes: Conjunctivae are normal. PERRL. EOMI. Mild photophobia Nose: No congestion/rhinnorhea. Mouth/Throat: Mucous membranes are moist.  Oropharynx non-erythematous. Neck: No stridor.  No cervical spine tenderness to palpation. Hematological/Lymphatic/Immunilogical: No cervical lymphadenopathy. Cardiovascular: Normal rate, regular rhythm. Grossly normal heart sounds.  Good peripheral circulation. Respiratory: Normal respiratory effort.  No retractions. Lungs CTAB. Gastrointestinal: Soft and nontender. No distention. No abdominal bruits. No CVA tenderness. }Musculoskeletal: No lower extremity tenderness nor edema.  No joint effusions. Neurologic:  Normal speech and language. No gross focal neurologic deficits are appreciated. No gait instability. Skin:  Skin is warm, dry and intact. No rash noted. Psychiatric: Mood and affect are normal. Speech and behavior are normal.  ____________________________________________   LABS (all labs ordered are listed, but only abnormal results are displayed)  Labs Reviewed  BASIC METABOLIC PANEL - Abnormal; Notable for the following:       Result Value   Glucose, Bld 212 (*)    All other components within normal limits  URINALYSIS, COMPLETE (UACMP) WITH MICROSCOPIC - Abnormal; Notable for the following:    Color, Urine YELLOW (*)    APPearance CLEAR (*)    All other components within normal limits  CBC WITH DIFFERENTIAL/PLATELET   ____________________________________________  EKG   ____________________________________________  RADIOLOGY  No results  found.  ____________________________________________   PROCEDURES  Procedure(s) performed: None  Procedures  Critical Care performed: No  ____________________________________________   INITIAL IMPRESSION / ASSESSMENT AND PLAN / ED COURSE  Pertinent labs & imaging results that were available during my care of the patient were reviewed by me and considered in my medical decision making (see chart for details).  Patient present with migraine headache which greatly improved with IV intervention consisting of Toradol, Reglan, Benadryl, and fluid rehydration. She is new-onset diabetes with glucose of 212 today. Patient did not take her morning doses of metformin. Patient advised to follow-up [facility]. Patient given a work note.    ____________________________________________   FINAL CLINICAL IMPRESSION(S) / ED DIAGNOSES  Final diagnoses:  Migraine without aura and without status migrainosus, not intractable  Diabetes mellitus due to underlying condition with hyperosmolarity without coma, without long-term current use of insulin (HCC)      NEW MEDICATIONS STARTED DURING THIS VISIT:  Current Discharge Medication List       Note:  This document was prepared using Dragon voice recognition software and may include unintentional dictation  errors.    Joni Reining, PA-C 02/21/17 1056    Don Perking, [place], MD 02/21/17 [phone]

## 2017-02-21 NOTE — ED Triage Notes (Signed)
Pt states her vision is blurry when she tries to read anything due to headache.

## 2017-02-21 NOTE — ED Notes (Signed)
See triage note  States she developed a migraine last pm  Positive photosensitive and nausea this am  States she was here about 1 1/2 weeks go for same

## 2017-02-21 NOTE — ED Triage Notes (Signed)
Pt states migraine "all over her head" with nausea and light sensitivity, was seen here a week ago for the same. States her job is requesting a note of absence. Pt appears in no distress, states she took all of the medication for migraines we gave her last time.

## 2017-02-24 ENCOUNTER — Emergency Department
Admission: EM | Admit: 2017-02-24 | Discharge: 2017-02-24 | Disposition: A | Discharge status: Home / Self Care | Payer: Self-pay | Attending: Emergency Medicine | Admitting: Emergency Medicine

## 2017-02-24 DIAGNOSIS — R55 Syncope and collapse: Secondary | ICD-10-CM

## 2017-02-24 DIAGNOSIS — Z79899 Other long term (current) drug therapy: Secondary | ICD-10-CM | POA: Insufficient documentation

## 2017-02-24 DIAGNOSIS — I10 Essential (primary) hypertension: Secondary | ICD-10-CM

## 2017-02-24 DIAGNOSIS — E119 Type 2 diabetes mellitus without complications: Secondary | ICD-10-CM | POA: Insufficient documentation

## 2017-02-24 DIAGNOSIS — G43009 Migraine without aura, not intractable, without status migrainosus: Secondary | ICD-10-CM

## 2017-02-24 DIAGNOSIS — Z7984 Long term (current) use of oral hypoglycemic drugs: Secondary | ICD-10-CM | POA: Insufficient documentation

## 2017-02-24 HISTORY — DX: Type 2 diabetes mellitus without complications: E11.9

## 2017-02-24 HISTORY — DX: Migraine, unspecified, not intractable, without status migrainosus: G43.909

## 2017-02-24 LAB — BASIC METABOLIC PANEL
Anion gap: 9 (ref 5–15)
BUN: 8 mg/dL (ref 6–20)
CO2: 23 mmol/L (ref 22–32)
Calcium: 9.4 mg/dL (ref 8.9–10.3)
Chloride: 105 mmol/L (ref 101–111)
Creatinine, Ser: 0.5 mg/dL (ref 0.44–1.00)
GFR calc Af Amer: >60 mL/min (ref >60)
GFR calc non Af Amer: >60 mL/min (ref >60)
Glucose, Bld: 194 mg/dL — ABNORMAL HIGH (ref 65–99)
Potassium: 3.5 mmol/L (ref 3.5–5.1)
Sodium: 137 mmol/L (ref 135–145)

## 2017-02-24 LAB — URINALYSIS, COMPLETE (UACMP) WITH MICROSCOPIC
Bacteria, UA: NONE SEEN
Bilirubin Urine: NEGATIVE
Glucose, UA: NEGATIVE mg/dL
Ketones, ur: NEGATIVE mg/dL
Leukocytes, UA: NEGATIVE
Nitrite: NEGATIVE
Protein, ur: 100 mg/dL — AB
Specific Gravity, Urine: 1.002 — ABNORMAL LOW (ref 1.005–1.030)
pH: 6 (ref 5.0–8.0)

## 2017-02-24 LAB — TROPONIN I: Troponin I: <0.03 ng/mL (ref <0.03)

## 2017-02-24 LAB — CBC
HCT: 36.1 % (ref 35.0–47.0)
Hemoglobin: 12.9 g/dL (ref 12.0–16.0)
MCH: 30.8 pg (ref 26.0–34.0)
MCHC: 35.8 g/dL (ref 32.0–36.0)
MCV: 85.8 fL (ref 80.0–100.0)
Platelets: 221 10*3/uL (ref 150–440)
RBC: 4.2 MIL/uL (ref 3.80–5.20)
RDW: 12.9 % (ref 11.5–14.5)
WBC: 9.5 10*3/uL (ref 3.6–11.0)

## 2017-02-24 LAB — MAGNESIUM: Magnesium: 1.5 mg/dL — ABNORMAL LOW (ref 1.7–2.4)

## 2017-02-24 MED ORDER — METOCLOPRAMIDE HCL 5 MG/ML IJ SOLN
10.0000 mg | INTRAMUSCULAR | Status: AC
  Administered 2017-02-24: 10 mg via INTRAVENOUS
  Filled 2017-02-24: qty 2

## 2017-02-24 MED ORDER — AMLODIPINE BESYLATE 5 MG PO TABS
5.0000 mg | ORAL_TABLET | Freq: Once | ORAL | Status: AC
  Administered 2017-02-24: 5 mg via ORAL
  Filled 2017-02-24: qty 1

## 2017-02-24 MED ORDER — AMLODIPINE BESYLATE 5 MG PO TABS
5.0000 mg | ORAL_TABLET | Freq: Every day | ORAL | 2 refills | Status: DC
Start: 2017-02-24 — End: 2020-02-12

## 2017-02-24 MED ORDER — DEXAMETHASONE SODIUM PHOSPHATE 10 MG/ML IJ SOLN
10.0000 mg | Freq: Once | INTRAMUSCULAR | Status: AC
  Administered 2017-02-24: 10 mg via INTRAVENOUS
  Filled 2017-02-24: qty 1

## 2017-02-24 MED ORDER — KETOROLAC TROMETHAMINE 30 MG/ML IJ SOLN
15.0000 mg | Freq: Once | INTRAMUSCULAR | Status: AC
  Administered 2017-02-24: 15 mg via INTRAVENOUS
  Filled 2017-02-24: qty 1

## 2017-02-24 MED ORDER — MAGNESIUM SULFATE 2 GM/50ML IV SOLN
2.0000 g | Freq: Once | INTRAVENOUS | Status: AC
  Administered 2017-02-24: 2 g via INTRAVENOUS
  Filled 2017-02-24: qty 50

## 2017-02-24 MED ORDER — DIPHENHYDRAMINE HCL 50 MG/ML IJ SOLN
12.5000 mg | INTRAMUSCULAR | Status: AC
  Administered 2017-02-24: 12.5 mg via INTRAVENOUS
  Filled 2017-02-24: qty 1

## 2017-02-24 MED ORDER — SODIUM CHLORIDE 0.9 % IV BOLUS (SEPSIS)
500.0000 mL | INTRAVENOUS | Status: AC
  Administered 2017-02-24: 500 mL via INTRAVENOUS
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
## 2017-02-24 NOTE — ED Triage Notes (Signed)
Patient coming from work at [employer]. Pt c/o weakness and headache. Pt recent dx of diabetes

## 2017-02-24 NOTE — ED Provider Notes (Signed)
signout from Dr. York Cerise to reassess this 41-year-old female who is presenting with headache, migrainous type, as well as high blood pressure. Last reevaluated after migraine treatment.  Physical Exam  BP (!) 163/99   Pulse 65   Temp 98.7 F (37.1 C) (Oral)   Resp (!) 28   Ht  (1.651 m)   Wt 81.6 kg (180 lb)   SpO2 100%   BMI 29.95 kg/m  ----------------------------------------- 8:02 AM on 02/24/2017 -----------------------------------------   Physical Exam Patient without any distress. Grossly neurologically intact. ED Course  Procedures  MDM Patient says that she now has a headache that is a 5 out of 10 down from a "100 out of 10." She is neurologically intact. She'll be discharged home with a prescription for amlodipine. Follow-up with primary care. She is understanding the plan and willing to comply.       Myrna Blazer, MD 02/24/17 [phone]

## 2017-02-24 NOTE — Discharge Instructions (Signed)
Since you continue to have issues with headaches and elevated blood pressure (particularly the diastolic pressure, or bottom number), and since you will not be able to follow up with a primary care doctor for another two months,we will start you on a blood pressure medication.  Please take it once daily as prescribed.  You should continue taking your regular medications as written as well.  Stay hydrated by drinking plenty of clear fluids.  Schedule an appointment with your primary care doctor for the next available opportunity as soon as you are able to do so financially.  Return to the emergency department if you develop new or worsening symptoms that concern you.

## 2017-04-19 ENCOUNTER — Emergency Department
Admission: EM | Admit: 2017-04-19 | Discharge: 2017-04-19 | Disposition: A | Discharge status: Home / Self Care | Payer: Self-pay | Attending: Emergency Medicine | Admitting: Emergency Medicine

## 2017-04-19 ENCOUNTER — Encounter: Payer: Self-pay | Admitting: Emergency Medicine

## 2017-04-19 ENCOUNTER — Emergency Department: Payer: Self-pay

## 2017-04-19 DIAGNOSIS — I10 Essential (primary) hypertension: Secondary | ICD-10-CM | POA: Insufficient documentation

## 2017-04-19 DIAGNOSIS — M25511 Pain in right shoulder: Secondary | ICD-10-CM | POA: Insufficient documentation

## 2017-04-19 DIAGNOSIS — R51 Headache: Secondary | ICD-10-CM | POA: Insufficient documentation

## 2017-04-19 DIAGNOSIS — E119 Type 2 diabetes mellitus without complications: Secondary | ICD-10-CM | POA: Insufficient documentation

## 2017-04-19 DIAGNOSIS — R519 Headache, unspecified: Secondary | ICD-10-CM

## 2017-04-19 MED ORDER — BUTALBITAL-APAP-CAFFEINE 50-325-40 MG PO TABS: 1.0000 | ORAL_TABLET | Freq: Four times a day (QID) | ORAL | 0 refills | Status: DC | PRN

## 2017-04-19 MED ORDER — BUTALBITAL-APAP-CAFFEINE 50-325-40 MG PO TABS
2.0000 | ORAL_TABLET | Freq: Once | ORAL | Status: AC
  Administered 2017-04-19: 2 via ORAL

## 2017-04-19 MED ORDER — BUTALBITAL-APAP-CAFFEINE 50-325-40 MG PO TABS
ORAL_TABLET | ORAL | Status: AC
  Filled 2017-04-19: qty 1

## 2017-04-19 MED ORDER — LORAZEPAM 1 MG PO TABS: 1.0000 mg | ORAL_TABLET | Freq: Three times a day (TID) | ORAL | 0 refills | Status: DC | PRN
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
## 2017-04-19 NOTE — ED Notes (Signed)
Pt states right shoulder pain after she fell yesterday, states left eye pain after fall but also states she had a migrane, awake and alert in no acute distress

## 2017-04-30 ENCOUNTER — Emergency Department: Payer: Worker's Compensation

## 2017-04-30 ENCOUNTER — Emergency Department
Admission: EM | Admit: 2017-04-30 | Discharge: 2017-04-30 | Disposition: A | Discharge status: Home / Self Care | Payer: Worker's Compensation | Attending: Student in an Organized Health Care Education/Training Program | Admitting: Student in an Organized Health Care Education/Training Program

## 2017-04-30 DIAGNOSIS — X58XXXA Exposure to other specified factors, initial encounter: Secondary | ICD-10-CM | POA: Diagnosis not present

## 2017-04-30 DIAGNOSIS — Y9389 Activity, other specified: Secondary | ICD-10-CM | POA: Diagnosis not present

## 2017-04-30 DIAGNOSIS — Y929 Unspecified place or not applicable: Secondary | ICD-10-CM | POA: Diagnosis not present

## 2017-04-30 DIAGNOSIS — S6992XA Unspecified injury of left wrist, hand and finger(s), initial encounter: Secondary | ICD-10-CM | POA: Diagnosis present

## 2017-04-30 DIAGNOSIS — Z7984 Long term (current) use of oral hypoglycemic drugs: Secondary | ICD-10-CM | POA: Diagnosis not present

## 2017-04-30 DIAGNOSIS — E119 Type 2 diabetes mellitus without complications: Secondary | ICD-10-CM | POA: Diagnosis not present

## 2017-04-30 DIAGNOSIS — S62367A Nondisplaced fracture of neck of fifth metacarpal bone, left hand, initial encounter for closed fracture: Secondary | ICD-10-CM | POA: Diagnosis not present

## 2017-04-30 DIAGNOSIS — I10 Essential (primary) hypertension: Secondary | ICD-10-CM | POA: Diagnosis not present

## 2017-04-30 DIAGNOSIS — Y990 Civilian activity done for income or pay: Secondary | ICD-10-CM | POA: Diagnosis not present

## 2017-04-30 MED ORDER — TRAMADOL HCL 50 MG PO TABS
50.0000 mg | ORAL_TABLET | Freq: Once | ORAL | Status: AC
  Administered 2017-04-30: 50 mg via ORAL
  Filled 2017-04-30: qty 1

## 2017-04-30 MED ORDER — ACETAMINOPHEN 500 MG PO TABS
1000.0000 mg | ORAL_TABLET | Freq: Once | ORAL | Status: AC
Start: 2017-04-30 — End: 2017-04-30
  Administered 2017-04-30: 1000 mg via ORAL
  Filled 2017-04-30: qty 2

## 2017-04-30 MED ORDER — TRAMADOL HCL 50 MG PO TABS: 50.0000 mg | ORAL_TABLET | Freq: Four times a day (QID) | ORAL | 0 refills | Status: DC | PRN
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
## 2017-04-30 NOTE — Discharge Instructions (Addendum)
Call and Make an Appointment with Dr. Allena KatzPatel who is on call for orthopedics. Ice and elevate. You will need to leave the splint on until seen by orthopedics. Tramadol 1 every 6 hours if needed for pain. He may also continue taking Tylenol with this medication. Take blood pressure medication when you home. Have blood pressure rechecked at your primary care office. Take restriction paper with you to work.

## 2017-04-30 NOTE — ED Notes (Signed)
Pt informed that RN needs to see pts ride before discharge. Pt has called ride.
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
## 2017-04-30 NOTE — ED Notes (Signed)
Splint applied by this RN. Color is appropriate in finger tips and pt has < 3 sec cap refill. Pt reports no change in sensation and denies feeling as though the splint is too tight. Pt instructed to keep splint dry and to keep splint on until she has followed up with ortho. Pt verbalized understanding.

## 2017-08-11 ENCOUNTER — Other Ambulatory Visit: Payer: Self-pay

## 2017-08-11 ENCOUNTER — Encounter: Payer: Self-pay | Admitting: Emergency Medicine

## 2017-08-11 ENCOUNTER — Emergency Department
Admission: EM | Admit: 2017-08-11 | Discharge: 2017-08-11 | Disposition: A | Discharge status: Home / Self Care | Payer: Medicaid Other | Attending: Emergency Medicine | Admitting: Emergency Medicine

## 2017-08-11 ENCOUNTER — Emergency Department: Payer: Medicaid Other

## 2017-08-11 DIAGNOSIS — Y92009 Unspecified place in unspecified non-institutional (private) residence as the place of occurrence of the external cause: Secondary | ICD-10-CM | POA: Insufficient documentation

## 2017-08-11 DIAGNOSIS — Z79899 Other long term (current) drug therapy: Secondary | ICD-10-CM | POA: Insufficient documentation

## 2017-08-11 DIAGNOSIS — E119 Type 2 diabetes mellitus without complications: Secondary | ICD-10-CM | POA: Insufficient documentation

## 2017-08-11 DIAGNOSIS — S6722XA Crushing injury of left hand, initial encounter: Secondary | ICD-10-CM

## 2017-08-11 DIAGNOSIS — W230XXA Caught, crushed, jammed, or pinched between moving objects, initial encounter: Secondary | ICD-10-CM | POA: Insufficient documentation

## 2017-08-11 DIAGNOSIS — Y939 Activity, unspecified: Secondary | ICD-10-CM | POA: Insufficient documentation

## 2017-08-11 DIAGNOSIS — Y999 Unspecified external cause status: Secondary | ICD-10-CM | POA: Insufficient documentation

## 2017-08-11 DIAGNOSIS — S60222A Contusion of left hand, initial encounter: Secondary | ICD-10-CM

## 2017-08-11 DIAGNOSIS — I10 Essential (primary) hypertension: Secondary | ICD-10-CM | POA: Insufficient documentation

## 2017-08-11 MED ORDER — TRAMADOL HCL 50 MG PO TABS: 50.0000 mg | ORAL_TABLET | Freq: Four times a day (QID) | ORAL | 0 refills | Status: DC | PRN

## 2017-08-11 MED ORDER — NAPROXEN 500 MG PO TABS
ORAL_TABLET | ORAL | Status: AC
  Filled 2017-08-11: qty 1

## 2017-08-11 MED ORDER — NAPROXEN 500 MG PO TABS
500.0000 mg | ORAL_TABLET | Freq: Once | ORAL | Status: AC
  Administered 2017-08-11: 500 mg via ORAL

## 2017-08-11 MED ORDER — NAPROXEN 500 MG PO TABS: 500.0000 mg | ORAL_TABLET | Freq: Two times a day (BID) | ORAL | 0 refills | Status: DC
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
## 2017-08-11 NOTE — ED Notes (Signed)
Left hand splint on pt per order, pt tolerated well.
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
## 2017-10-11 ENCOUNTER — Other Ambulatory Visit: Payer: Self-pay

## 2017-10-11 ENCOUNTER — Emergency Department: Payer: Worker's Compensation

## 2017-10-11 ENCOUNTER — Emergency Department
Admission: EM | Admit: 2017-10-11 | Discharge: 2017-10-11 | Disposition: A | Discharge status: Home / Self Care | Payer: Worker's Compensation | Attending: Emergency Medicine | Admitting: Emergency Medicine

## 2017-10-11 ENCOUNTER — Encounter: Payer: Self-pay | Admitting: Emergency Medicine

## 2017-10-11 DIAGNOSIS — Y990 Civilian activity done for income or pay: Secondary | ICD-10-CM | POA: Insufficient documentation

## 2017-10-11 DIAGNOSIS — S40011A Contusion of right shoulder, initial encounter: Secondary | ICD-10-CM | POA: Diagnosis not present

## 2017-10-11 DIAGNOSIS — W208XXA Other cause of strike by thrown, projected or falling object, initial encounter: Secondary | ICD-10-CM | POA: Diagnosis not present

## 2017-10-11 DIAGNOSIS — S4991XA Unspecified injury of right shoulder and upper arm, initial encounter: Secondary | ICD-10-CM | POA: Diagnosis present

## 2017-10-11 DIAGNOSIS — Y92538 Other ambulatory health services establishments as the place of occurrence of the external cause: Secondary | ICD-10-CM | POA: Insufficient documentation

## 2017-10-11 DIAGNOSIS — E119 Type 2 diabetes mellitus without complications: Secondary | ICD-10-CM | POA: Diagnosis not present

## 2017-10-11 DIAGNOSIS — Y9389 Activity, other specified: Secondary | ICD-10-CM | POA: Diagnosis not present

## 2017-10-11 DIAGNOSIS — I10 Essential (primary) hypertension: Secondary | ICD-10-CM | POA: Diagnosis not present

## 2017-10-11 MED ORDER — DIAZEPAM 5 MG PO TABS: 5.0000 mg | ORAL_TABLET | Freq: Three times a day (TID) | ORAL | 0 refills | Status: DC | PRN

## 2017-10-11 MED ORDER — IBUPROFEN 800 MG PO TABS: 800.0000 mg | ORAL_TABLET | Freq: Three times a day (TID) | ORAL | 0 refills | Status: DC | PRN

## 2017-10-11 MED ORDER — OXYCODONE-ACETAMINOPHEN 5-325 MG PO TABS
2.0000 | ORAL_TABLET | Freq: Once | ORAL | Status: AC
  Administered 2017-10-11: 2 via ORAL
  Filled 2017-10-11: qty 2

## 2017-10-11 NOTE — ED Notes (Signed)
Per Beverley Fiedler , resident care manager urine drug screen needed for workers comp

## 2017-10-11 NOTE — ED Notes (Signed)
Helen Webster, NT completing workers comp before pain meds are given

## 2017-10-11 NOTE — ED Notes (Signed)
The workers compensation was completed and sent to the lab.

## 2017-10-11 NOTE — ED Notes (Signed)
Pt employer here to pick up pt at discharge. PT in NAD, VSS, pt ambulatory with steady gait noted
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
## 2017-10-11 NOTE — ED Notes (Signed)
Steady gait noted to lobby

## 2017-10-11 NOTE — ED Triage Notes (Signed)
Patient ambulatory to triage with steady gait, without difficulty or distress noted; pt reports chart holder fell on her right shoulder PTA while at work; [employer] with [employer] (profile indicates testing only on request)

## 2018-02-22 ENCOUNTER — Other Ambulatory Visit: Payer: Self-pay

## 2018-02-22 ENCOUNTER — Encounter: Payer: Self-pay | Admitting: Emergency Medicine

## 2018-02-22 ENCOUNTER — Emergency Department
Admission: EM | Admit: 2018-02-22 | Discharge: 2018-02-22 | Disposition: A | Discharge status: Home / Self Care | Payer: BLUE CROSS/BLUE SHIELD | Attending: Emergency Medicine | Admitting: Emergency Medicine

## 2018-02-22 DIAGNOSIS — Z79899 Other long term (current) drug therapy: Secondary | ICD-10-CM | POA: Insufficient documentation

## 2018-02-22 DIAGNOSIS — M5441 Lumbago with sciatica, right side: Secondary | ICD-10-CM | POA: Diagnosis not present

## 2018-02-22 DIAGNOSIS — E119 Type 2 diabetes mellitus without complications: Secondary | ICD-10-CM | POA: Diagnosis not present

## 2018-02-22 DIAGNOSIS — I10 Essential (primary) hypertension: Secondary | ICD-10-CM | POA: Insufficient documentation

## 2018-02-22 DIAGNOSIS — M545 Low back pain: Secondary | ICD-10-CM | POA: Diagnosis present

## 2018-02-22 MED ORDER — PREDNISONE 20 MG PO TABS
60.0000 mg | ORAL_TABLET | Freq: Once | ORAL | Status: AC
  Administered 2018-02-22: 60 mg via ORAL
  Filled 2018-02-22: qty 3

## 2018-02-22 MED ORDER — METHOCARBAMOL 500 MG PO TABS
1000.0000 mg | ORAL_TABLET | Freq: Once | ORAL | Status: AC
  Administered 2018-02-22: 1000 mg via ORAL
  Filled 2018-02-22: qty 2

## 2018-02-22 MED ORDER — PREDNISONE 50 MG PO TABS: ORAL_TABLET | ORAL | 0 refills | Status: DC

## 2018-02-22 MED ORDER — METHOCARBAMOL 500 MG PO TABS: 500.0000 mg | ORAL_TABLET | Freq: Three times a day (TID) | ORAL | 0 refills | Status: AC | PRN

## 2018-02-22 NOTE — ED Notes (Signed)
Pt had difficulties moving from wheelchair to bed, as well as raising the tight leg onto the bed. States pain is constant and sharp, increasing when laying down flat or standing.

## 2018-02-22 NOTE — ED Provider Notes (Signed)
[facility] Emergency Department Provider Note  ____________________________________________  Time seen: Approximately 3:52 PM  I have reviewed the triage vital signs and the nursing notes.   HISTORY  Chief Complaint Back Pain    HPI Helen Webster is a 42 y.o. female presents to the emergency department with acute on chronic 7 out of 10 low back pain with right lower extremity radiculopathy that radiates to the right toes.  No dysuria, hematuria or increased urinary frequency.  Patient reports that her most recent episode of sciatica occurred 2 years ago and patient has not really had symptoms since.  Symptoms are worsened with ambulation and relieved somewhat with rest.  Patient reports that she was lifting a patient 2 days ago when she felt pain.  She denies weakness or changes in sensation.  No bowel or bladder incontinence or saddle anesthesia.  No alleviating measures have been attempted.  Patient has tried ibuprofen at home which has not relieved her symptoms.   Past Medical History:  Diagnosis Date  . Diabetes mellitus without complication (HCC)   . Hypertension   . Migraine     There are no active problems to display for this patient.   Past Surgical History:  Procedure Laterality Date  . CESAREAN SECTION      Prior to Admission medications   Medication Sig Start Date End Date Taking? Authorizing Provider  amLODipine (NORVASC) 5 MG tablet Take 1 tablet (5 mg total) by mouth daily. 02/24/17 02/24/18  Loleta Rose, MD  diazepam (VALIUM) 5 MG tablet Take 1 tablet (5 mg total) by mouth every 8 (eight) hours as needed for muscle spasms. 10/11/17   Emily Filbert, MD  ibuprofen (ADVIL,MOTRIN) 800 MG tablet Take 1 tablet (800 mg total) by mouth every 8 (eight) hours as needed. 10/11/17   Emily Filbert, MD  metFORMIN (GLUCOPHAGE) 500 MG tablet Take 1 tablet (500 mg total) by mouth 2 (two) times daily with a meal. Patient taking differently: Take 500  mg daily with breakfast by mouth.  02/09/17 02/09/18  Rebecka Apley, MD  methocarbamol (ROBAXIN) 500 MG tablet Take 1 tablet (500 mg total) by mouth 3 (three) times daily as needed for up to 5 days. 02/22/18 02/27/18  Orvil Feil, PA-C  naproxen (NAPROSYN) 500 MG tablet Take 1 tablet (500 mg total) by mouth 2 (two) times daily with a meal. 08/11/17   Triplett, Cari B, FNP  predniSONE (DELTASONE) 50 MG tablet Take one 50 mg tablet once daily for the next five days. 02/22/18   Orvil Feil, PA-C  traMADol (ULTRAM) 50 MG tablet Take 1 tablet (50 mg total) by mouth every 6 (six) hours as needed. 08/11/17   Chinita Pester, FNP    Allergies Patient has no known allergies.  No family history on file.  Social History Social History   Tobacco Use  . Smoking status: Never Smoker  . Smokeless tobacco: Never Used  Substance Use Topics  . Alcohol use: No  . Drug use: No     Review of Systems  Constitutional: No fever/chills Eyes: No visual changes. No discharge ENT: No upper respiratory complaints. Cardiovascular: no chest pain. Respiratory: no cough. No SOB. Gastrointestinal: No abdominal pain.  No nausea, no vomiting.  No diarrhea.  No constipation. Genitourinary: Negative for dysuria. No hematuria Musculoskeletal: Patient has low back pain.  Skin: Negative for rash, abrasions, lacerations, ecchymosis. Neurological: Negative for headaches, focal weakness or numbness.   ____________________________________________   PHYSICAL EXAM:  VITAL SIGNS: ED Triage Vitals  Enc Vitals Group     BP 02/22/18 1519 (!) 150/99     Pulse Rate 02/22/18 1519 91     Resp 02/22/18 1519 15     Temp --      Temp src --      SpO2 02/22/18 1519 98 %     Weight 02/22/18 1423 179 lb 14.3 oz (81.6 kg)     Height 02/22/18 1423 5\' 4"  (1.626 m)     Head Circumference --      Peak Flow --      Pain Score 02/22/18 1423 8     Pain Loc --      Pain Edu? --      Excl. in GC? --      Constitutional:  Alert and oriented. Well appearing and in no acute distress. Eyes: Conjunctivae are normal. PERRL. EOMI. Head: Atraumatic. Cardiovascular: Normal rate, regular rhythm. Normal S1 and S2.  Good peripheral circulation. Respiratory: Normal respiratory effort without tachypnea or retractions. Lungs CTAB. Good air entry to the bases with no decreased or absent breath sounds. Gastrointestinal: Bowel sounds 4 quadrants. Soft and nontender to palpation. No guarding or rigidity. No palpable masses. No distention. No CVA tenderness. Musculoskeletal: Full range of motion to all extremities. No gross deformities appreciated.  Positive straight leg raise, right.  Her spinal muscle tenderness along the lumbar spine elicited. Neurologic:  Normal speech and language. No gross focal neurologic deficits are appreciated.  Skin:  Skin is warm, dry and intact. No rash noted. Psychiatric: Mood and affect are normal. Speech and behavior are normal. Patient exhibits appropriate insight and judgement.   ____________________________________________   LABS (all labs ordered are listed, but only abnormal results are displayed)  Labs Reviewed - No data to display ____________________________________________  EKG   ____________________________________________  RADIOLOGY   No results found.  ____________________________________________    PROCEDURES  Procedure(s) performed:    Procedures    Medications  methocarbamol (ROBAXIN) tablet 1,000 mg (1,000 mg Oral Given 02/22/18 1518)  predniSONE (DELTASONE) tablet 60 mg (60 mg Oral Given 02/22/18 1518)     ____________________________________________   INITIAL IMPRESSION / ASSESSMENT AND PLAN / ED COURSE  Pertinent labs & imaging results that were available during my care of the patient were reviewed by me and considered in my medical decision making (see chart for details).  Review of the [place] CSRS was performed in accordance of the [facility] prior to  dispensing any controlled drugs.      Assessment and plan Low back pain with right lower extremity radiculopathy Patient presents to the emergency department with low back pain with right lower extremity radiculopathy that has occurred for the past 2 days.  Patient was given her first dose of prednisone in the emergency department as well as Robaxin.  She was discharged with prednisone and Robaxin.  She was advised to follow-up with primary care as needed.  All patient questions were answered.    ____________________________________________  FINAL CLINICAL IMPRESSION(S) / ED DIAGNOSES  Final diagnoses:  Acute right-sided low back pain with right-sided sciatica      NEW MEDICATIONS STARTED DURING THIS VISIT:  ED Discharge Orders         Ordered    methocarbamol (ROBAXIN) 500 MG tablet  3 times daily PRN     02/22/18 1513    predniSONE (DELTASONE) 50 MG tablet     02/22/18 1513  This chart was dictated using voice recognition software/Dragon. Despite best efforts to proofread, errors can occur which can change the meaning. Any change was purely unintentional.    Orvil FeilWoods, Scottie Metayer M, PA-C 02/22/18 1559    Sharyn CreamerQuale, Mark, MD 02/22/18 [phone]

## 2018-02-22 NOTE — ED Triage Notes (Signed)
Right lower back pain radiating down right leg x 2 days.

## 2018-04-11 ENCOUNTER — Other Ambulatory Visit: Payer: Self-pay

## 2018-04-11 ENCOUNTER — Encounter: Payer: Self-pay | Admitting: Emergency Medicine

## 2018-04-11 ENCOUNTER — Emergency Department
Admission: EM | Admit: 2018-04-11 | Discharge: 2018-04-11 | Disposition: A | Discharge status: Home / Self Care | Payer: BLUE CROSS/BLUE SHIELD | Attending: Emergency Medicine | Admitting: Emergency Medicine

## 2018-04-11 DIAGNOSIS — H1031 Unspecified acute conjunctivitis, right eye: Secondary | ICD-10-CM

## 2018-04-11 DIAGNOSIS — I10 Essential (primary) hypertension: Secondary | ICD-10-CM | POA: Insufficient documentation

## 2018-04-11 DIAGNOSIS — Z79899 Other long term (current) drug therapy: Secondary | ICD-10-CM | POA: Insufficient documentation

## 2018-04-11 DIAGNOSIS — J069 Acute upper respiratory infection, unspecified: Secondary | ICD-10-CM | POA: Insufficient documentation

## 2018-04-11 DIAGNOSIS — H1089 Other conjunctivitis: Secondary | ICD-10-CM | POA: Insufficient documentation

## 2018-04-11 DIAGNOSIS — E119 Type 2 diabetes mellitus without complications: Secondary | ICD-10-CM | POA: Insufficient documentation

## 2018-04-11 MED ORDER — BENZONATATE 200 MG PO CAPS: 200.0000 mg | ORAL_CAPSULE | Freq: Three times a day (TID) | ORAL | 0 refills | Status: DC | PRN

## 2018-04-11 MED ORDER — AMOXICILLIN 875 MG PO TABS: 875.0000 mg | ORAL_TABLET | Freq: Two times a day (BID) | ORAL | 0 refills | Status: DC

## 2018-04-11 MED ORDER — TOBRAMYCIN 0.3 % OP SOLN: 2.0000 [drp] | OPHTHALMIC | 0 refills | Status: DC

## 2018-04-11 NOTE — ED Triage Notes (Signed)
Presents with 2 week hx of cough  Had some fever couple of days ago  Woke up with irritation and drainage to right eye this am

## 2018-04-11 NOTE — Discharge Instructions (Addendum)
Follow-up with your regular doctor if not better in 3 to 5 days.  Return emergency department if worsening.  Use medications as prescribed.

## 2018-04-11 NOTE — ED Provider Notes (Signed)
[facility] Emergency Department Provider Note  ____________________________________________   First MD Initiated Contact with Patient 04/11/18 1024     (approximate)  I have reviewed the triage vital signs and the nursing notes.   HISTORY  Chief Complaint Eye Problem and Cough    HPI Helen Webster is a 42 y.o. female presents emergency department complaining of cough and congestion with yellow to green mucus.  States her right eye was matted shut this morning and continues to drain without any redness to the eye.  Cough for 2 weeks, eye drainage and matting for 2 days.  Kids have had the same sx.  Denies fever, chills, chest pain or shortness of breath.   Past Medical History:  Diagnosis Date  . Diabetes mellitus without complication (HCC)   . Hypertension   . Migraine     There are no active problems to display for this patient.   Past Surgical History:  Procedure Laterality Date  . CESAREAN SECTION      Prior to Admission medications   Medication Sig Start Date End Date Taking? Authorizing Provider  amLODipine (NORVASC) 5 MG tablet Take 1 tablet (5 mg total) by mouth daily. 02/24/17 02/24/18  Loleta Rose, MD  amoxicillin (AMOXIL) 875 MG tablet Take 1 tablet (875 mg total) by mouth 2 (two) times daily. 04/11/18   Aniel Hubble, Roselyn Bering, PA-C  benzonatate (TESSALON) 200 MG capsule Take 1 capsule (200 mg total) by mouth 3 (three) times daily as needed for cough. 04/11/18   Orma Cheetham, Roselyn Bering, PA-C  metFORMIN (GLUCOPHAGE) 500 MG tablet Take 1 tablet (500 mg total) by mouth 2 (two) times daily with a meal. Patient taking differently: Take 500 mg daily with breakfast by mouth.  02/09/17 02/09/18  Rebecka Apley, MD  naproxen (NAPROSYN) 500 MG tablet Take 1 tablet (500 mg total) by mouth 2 (two) times daily with a meal. 08/11/17   Triplett, Cari B, FNP  tobramycin (TOBREX) 0.3 % ophthalmic solution Place 2 drops into both eyes every 4 (four) hours. 04/11/18    Faythe Ghee, PA-C    Allergies Patient has no known allergies.  No family history on file.  Social History Social History   Tobacco Use  . Smoking status: Never Smoker  . Smokeless tobacco: Never Used  Substance Use Topics  . Alcohol use: No  . Drug use: No    Review of Systems  Constitutional: No fever/chills Eyes: No visual changes. Positive for eye draining and matting ENT: No sore throat. Respiratory: Positive cough and congestion, positive wheezing Genitourinary: Negative for dysuria. Musculoskeletal: Negative for back pain. Skin: Negative for rash.    ____________________________________________   PHYSICAL EXAM:  VITAL SIGNS: ED Triage Vitals  Enc Vitals Group     BP 04/11/18 1026 (!) 150/90     Pulse Rate 04/11/18 1026 89     Resp --      Temp 04/11/18 1026 98.3 F (36.8 C)     Temp Source 04/11/18 1026 Oral     SpO2 04/11/18 1026 96 %     Weight 04/11/18 1031 180 lb (81.6 kg)     Height 04/11/18 1031 5\' 4"  (1.626 m)     Head Circumference --      Peak Flow --      Pain Score 04/11/18 1027 4     Pain Loc --      Pain Edu? --      Excl. in GC? --  Constitutional: Alert and oriented. Well appearing and in no acute distress. Eyes: Conjunctivae are normal. Some exudate on the right lashes Head: Atraumatic. ENT: TMS clear bilaterally Nose: No congestion/rhinnorhea. Mouth/Throat: Mucous membranes are moist.   NECK: Is supple, no lymphadenopathy is noted  cardiovascular: Normal rate, regular rhythm.  Heart sounds are normal Respiratory: Normal respiratory effort.  No retractions, lungs clear to auscultation, cough is dry and hacking GU: deferred Musculoskeletal: FROM all extremities, warm and well perfused Neurologic:  Normal speech and language.  Skin:  Skin is warm, dry and intact. No rash noted. Psychiatric: Mood and affect are normal. Speech and behavior are normal.  ____________________________________________   LABS (all labs  ordered are listed, but only abnormal results are displayed)  Labs Reviewed - No data to display ____________________________________________   ____________________________________________  RADIOLOGY    ____________________________________________   PROCEDURES  Procedure(s) performed: No  Procedures    ____________________________________________   INITIAL IMPRESSION / ASSESSMENT AND PLAN / ED COURSE  Pertinent labs & imaging results that were available during my care of the patient were reviewed by me and considered in my medical decision making (see chart for details).   Patient is 42 year old female presents emergency department complaining of cough for 2 weeks and right eye matting and drainage overnight.  On physical exam patient appears well.  The right eye has some exudate noted on the lashes.  Cough is dry and hacking.  Her exam is otherwise unremarkable.  Explained findings to the patient.  She was given a prescription for amoxicillin, tobramycin ophthalmic drops, and Tessalon Perles.  She was given a work note as requested.  She is to follow-up with her regular doctor if not improving in 3 to 5 days.  Return emergency department if worsening.  She states she understands will comply with her treatment plan.  She is discharged in stable condition.     As part of my medical decision making, I reviewed the following data within the electronic MEDICAL RECORD NUMBER Nursing notes reviewed and incorporated, Notes from prior ED visits and [place] Controlled Substance Database  ____________________________________________   FINAL CLINICAL IMPRESSION(S) / ED DIAGNOSES  Final diagnoses:  Acute upper respiratory infection  Acute bacterial conjunctivitis of right eye      NEW MEDICATIONS STARTED DURING THIS VISIT:  New Prescriptions   AMOXICILLIN (AMOXIL) 875 MG TABLET    Take 1 tablet (875 mg total) by mouth 2 (two) times daily.   BENZONATATE (TESSALON) 200 MG CAPSULE     Take 1 capsule (200 mg total) by mouth 3 (three) times daily as needed for cough.   TOBRAMYCIN (TOBREX) 0.3 % OPHTHALMIC SOLUTION    Place 2 drops into both eyes every 4 (four) hours.     Note:  This document was prepared using Dragon voice recognition software and may include unintentional dictation errors.     Faythe Ghee, PA-C 04/11/18 1049    Jene Every, MD 04/11/18 [phone]

## 2018-07-16 ENCOUNTER — Encounter: Payer: Self-pay | Admitting: *Deleted

## 2018-07-16 ENCOUNTER — Ambulatory Visit: Payer: Self-pay | Admitting: Family Medicine

## 2018-07-16 ENCOUNTER — Emergency Department
Admission: EM | Admit: 2018-07-16 | Discharge: 2018-07-16 | Disposition: A | Discharge status: Home / Self Care | Payer: BLUE CROSS/BLUE SHIELD | Attending: Emergency Medicine | Admitting: Emergency Medicine

## 2018-07-16 DIAGNOSIS — I10 Essential (primary) hypertension: Secondary | ICD-10-CM | POA: Insufficient documentation

## 2018-07-16 DIAGNOSIS — S46911A Strain of unspecified muscle, fascia and tendon at shoulder and upper arm level, right arm, initial encounter: Secondary | ICD-10-CM | POA: Insufficient documentation

## 2018-07-16 DIAGNOSIS — Y929 Unspecified place or not applicable: Secondary | ICD-10-CM | POA: Insufficient documentation

## 2018-07-16 DIAGNOSIS — S4991XA Unspecified injury of right shoulder and upper arm, initial encounter: Secondary | ICD-10-CM | POA: Diagnosis present

## 2018-07-16 DIAGNOSIS — Y998 Other external cause status: Secondary | ICD-10-CM | POA: Diagnosis not present

## 2018-07-16 DIAGNOSIS — E119 Type 2 diabetes mellitus without complications: Secondary | ICD-10-CM | POA: Diagnosis not present

## 2018-07-16 DIAGNOSIS — Y9389 Activity, other specified: Secondary | ICD-10-CM | POA: Diagnosis not present

## 2018-07-16 DIAGNOSIS — X500XXA Overexertion from strenuous movement or load, initial encounter: Secondary | ICD-10-CM | POA: Insufficient documentation

## 2018-07-16 DIAGNOSIS — T148XXA Other injury of unspecified body region, initial encounter: Secondary | ICD-10-CM

## 2018-07-16 MED ORDER — LIDOCAINE 5 % EX PTCH: 1.0000 | MEDICATED_PATCH | CUTANEOUS | 0 refills | Status: DC

## 2018-07-16 MED ORDER — CYCLOBENZAPRINE HCL 5 MG PO TABS: ORAL_TABLET | ORAL | 0 refills | Status: DC

## 2018-07-16 MED ORDER — IBUPROFEN 600 MG PO TABS: 600.0000 mg | ORAL_TABLET | Freq: Four times a day (QID) | ORAL | 0 refills | Status: DC | PRN

## 2018-07-16 MED ORDER — OXYCODONE-ACETAMINOPHEN 5-325 MG PO TABS
1.0000 | ORAL_TABLET | Freq: Once | ORAL | Status: AC
  Administered 2018-07-16: 1 via ORAL
  Filled 2018-07-16: qty 1

## 2018-07-16 MED ORDER — ORPHENADRINE CITRATE 30 MG/ML IJ SOLN
60.0000 mg | Freq: Two times a day (BID) | INTRAMUSCULAR | Status: DC
  Administered 2018-07-16: 60 mg via INTRAMUSCULAR
  Filled 2018-07-16: qty 2

## 2018-07-16 MED ORDER — KETOROLAC TROMETHAMINE 30 MG/ML IJ SOLN
30.0000 mg | Freq: Once | INTRAMUSCULAR | Status: AC
  Administered 2018-07-16: 30 mg via INTRAMUSCULAR
  Filled 2018-07-16: qty 1

## 2018-07-16 MED ORDER — LIDOCAINE 5 % EX PTCH
1.0000 | MEDICATED_PATCH | CUTANEOUS | Status: DC
  Administered 2018-07-16: 1 via TRANSDERMAL
  Filled 2018-07-16: qty 1

## 2018-07-16 NOTE — ED Notes (Signed)
See triage note   Presents with right shoulder pain  States pain started about 4 days ago  Denies any specific injury  But does carry her child a lot  No deformity noted

## 2018-07-16 NOTE — ED Provider Notes (Signed)
[facility] Emergency Department Provider Note  ____________________________________________  Time seen: Approximately 11:20 AM  I have reviewed the triage vital signs and the nursing notes.   HISTORY  Chief Complaint Shoulder Pain    HPI Helen Webster is a 43 y.o. female that presents to the emergency department for evaluation of back pain with shoulder movement. Pain is worse with movement of right shoulder. She has had bursitis in this shoulder in the.  The only trauma that she can think of is when some files fell on her one year ago.  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  She is also lifting her 37-year-old with this arm regularly.  No headache, SOB, CP, nausea, vomiting, abdominal pain.    Past Medical History:  Diagnosis Date  . Diabetes mellitus without complication (HCC)   . Hypertension   . Migraine     There are no active problems to display for this patient.   Past Surgical History:  Procedure Laterality Date  . CESAREAN SECTION      Prior to Admission medications   Medication Sig Start Date End Date Taking? Authorizing Provider  amLODipine (NORVASC) 5 MG tablet Take 1 tablet (5 mg total) by mouth daily. 02/24/17 02/24/18  Loleta Rose, MD  amoxicillin (AMOXIL) 875 MG tablet Take 1 tablet (875 mg total) by mouth 2 (two) times daily. 04/11/18   Fisher, Roselyn Bering, PA-C  benzonatate (TESSALON) 200 MG capsule Take 1 capsule (200 mg total) by mouth 3 (three) times daily as needed for cough. 04/11/18   Sherrie Mustache Roselyn Bering, PA-C  cyclobenzaprine (FLEXERIL) 5 MG tablet Take 1-2 tablets 3 times daily as needed 07/16/18   Enid Derry, PA-C  ibuprofen (ADVIL,MOTRIN) 600 MG tablet Take 1 tablet (600 mg total) by mouth every 6 (six) hours as needed. 07/16/18   Enid Derry, PA-C  lidocaine (LIDODERM) 5 % Place 1 patch onto the skin daily. Remove & Discard patch within 12 hours or as directed by MD 07/16/18   Enid Derry, PA-C   metFORMIN (GLUCOPHAGE) 500 MG tablet Take 1 tablet (500 mg total) by mouth 2 (two) times daily with a meal. Patient taking differently: Take 500 mg daily with breakfast by mouth.  02/09/17 02/09/18  Rebecka Apley, MD  naproxen (NAPROSYN) 500 MG tablet Take 1 tablet (500 mg total) by mouth 2 (two) times daily with a meal. 08/11/17   Triplett, Cari B, FNP  tobramycin (TOBREX) 0.3 % ophthalmic solution Place 2 drops into both eyes every 4 (four) hours. 04/11/18   Faythe Ghee, PA-C    Allergies Patient has no known allergies.  No family history on file.  Social History Social History   Tobacco Use  . Smoking status: Never Smoker  . Smokeless tobacco: Never Used  Substance Use Topics  . Alcohol use: No  . Drug use: No     Review of Systems  Constitutional: No fever/chills ENT: No upper respiratory complaints. Cardiovascular: No chest pain. Respiratory: No cough. No SOB. Gastrointestinal: No abdominal pain.  No nausea, no vomiting.  Genitourinary: Negative for dysuria. Musculoskeletal: Positive for back pain.  Skin: Negative for rash, abrasions, lacerations, ecchymosis. Neurological: Negative for headaches, numbness or tingling   ____________________________________________   PHYSICAL EXAM:  VITAL SIGNS: ED Triage Vitals [07/16/18 1014]  Enc Vitals Group     BP (!) 156/108     Pulse Rate 80     Resp 16     Temp 98.9  F (37.2 C)     Temp Source Oral     SpO2 99 %     Weight 175 lb (79.4 kg)     Height 5\' 4"  (1.626 m)     Head Circumference      Peak Flow      Pain Score 10     Pain Loc      Pain Edu?      Excl. in GC?      Constitutional: Alert and oriented. Well appearing and in no acute distress. Eyes: Conjunctivae are normal. PERRL. EOMI. Head: Atraumatic. ENT:      Ears:      Nose: No congestion/rhinnorhea.      Mouth/Throat: Mucous membranes are moist.  Neck: No stridor. No cervical spine tenderness to palpation. Pain with right rotation and  right lateral bending of neck.  Cardiovascular: Normal rate, regular rhythm.  Good peripheral circulation. Respiratory: Normal respiratory effort without tachypnea or retractions. Lungs CTAB. Good air entry to the bases with no decreased or absent breath sounds. Gastrointestinal: Bowel sounds 4 quadrants. Soft and nontender to palpation. No guarding or rigidity. No palpable masses. No distention.  Musculoskeletal: Full range of motion to all extremities. No gross deformities appreciated.  Tenderness to palpation over right scapula.  Pain with flexion and external rotation of right shoulder. Pain past 90 abduction of right shoulder. Strength equal in upper extremities bilaterally. Grip strength intact.  Neurologic:  Normal speech and language. No gross focal neurologic deficits are appreciated.  Skin:  Skin is warm, dry and intact. No rash noted. Psychiatric: Mood and affect are normal. Speech and behavior are normal. Patient exhibits appropriate insight and judgement.   ____________________________________________   LABS (all labs ordered are listed, but only abnormal results are displayed)  Labs Reviewed - No data to display ____________________________________________  EKG   ____________________________________________  RADIOLOGY   No results found.  ____________________________________________    PROCEDURES  Procedure(s) performed:    Procedures    Medications  orphenadrine (NORFLEX) injection 60 mg (60 mg Intramuscular Given 07/16/18 1233)  lidocaine (LIDODERM) 5 % 1 patch (1 patch Transdermal Patch Applied 07/16/18 1227)  ketorolac (TORADOL) 30 MG/ML injection 30 mg (30 mg Intramuscular Given 07/16/18 1230)  oxyCODONE-acetaminophen (PERCOCET/ROXICET) 5-325 MG per tablet 1 tablet (1 tablet Oral Given 07/16/18 1227)     ____________________________________________   INITIAL IMPRESSION / ASSESSMENT AND PLAN / ED COURSE  Pertinent labs & imaging results that were  available during my care of the patient were reviewed by me and considered in my medical decision making (see chart for details).  Review of the [place] CSRS was performed in accordance of the [facility] prior to dispensing any controlled drugs.     Patient's diagnosis is consistent with muscle strain.  Vital signs and exam are reassuring.  Pain is reproducible with palpation and range of motion of right shoulder.  Patient was given IM Toradol, Norflex, dose of Percocet in the emergency department.  Patient will be discharged home with prescriptions for Motrin, Flexeril, Lidoderm. Patient is to follow up with primary care as directed. Patient is given ED precautions to return to the ED for any worsening or new symptoms.     ____________________________________________  FINAL CLINICAL IMPRESSION(S) / ED DIAGNOSES  Final diagnoses:  Muscle strain      NEW MEDICATIONS STARTED DURING THIS VISIT:  ED Discharge Orders         Ordered    ibuprofen (ADVIL,MOTRIN) 600 MG tablet  Every 6  hours PRN     07/16/18 1208    cyclobenzaprine (FLEXERIL) 5 MG tablet     07/16/18 1208    lidocaine (LIDODERM) 5 %  Every 24 hours     07/16/18 1208              This chart was dictated using voice recognition software/Dragon. Despite best efforts to proofread, errors can occur which can change the meaning. Any change was purely unintentional.    Enid Derry, PA-C 07/16/18 1346    Don Perking, [place], MD 07/16/18 [phone]

## 2018-07-16 NOTE — ED Triage Notes (Signed)
Pt is here for right shoulder pain x3 days.  Not associated with any trauma.  Pt states that she carries her 43 year old a lot.  Pt reports sharp pain and increased pain with movement and difficulty lifting her arm.

## 2018-08-11 ENCOUNTER — Emergency Department: Payer: BLUE CROSS/BLUE SHIELD

## 2018-08-11 ENCOUNTER — Emergency Department
Admission: EM | Admit: 2018-08-11 | Discharge: 2018-08-12 | Disposition: A | Discharge status: Home / Self Care | Payer: BLUE CROSS/BLUE SHIELD | Attending: Emergency Medicine | Admitting: Emergency Medicine

## 2018-08-11 ENCOUNTER — Other Ambulatory Visit: Payer: Self-pay

## 2018-08-11 DIAGNOSIS — R1030 Lower abdominal pain, unspecified: Secondary | ICD-10-CM | POA: Diagnosis not present

## 2018-08-11 DIAGNOSIS — E119 Type 2 diabetes mellitus without complications: Secondary | ICD-10-CM | POA: Insufficient documentation

## 2018-08-11 DIAGNOSIS — I10 Essential (primary) hypertension: Secondary | ICD-10-CM | POA: Insufficient documentation

## 2018-08-11 DIAGNOSIS — R52 Pain, unspecified: Secondary | ICD-10-CM

## 2018-08-11 DIAGNOSIS — R197 Diarrhea, unspecified: Secondary | ICD-10-CM | POA: Insufficient documentation

## 2018-08-11 DIAGNOSIS — Z79899 Other long term (current) drug therapy: Secondary | ICD-10-CM | POA: Diagnosis not present

## 2018-08-11 DIAGNOSIS — Z7984 Long term (current) use of oral hypoglycemic drugs: Secondary | ICD-10-CM | POA: Diagnosis not present

## 2018-08-11 DIAGNOSIS — R1032 Left lower quadrant pain: Secondary | ICD-10-CM | POA: Diagnosis present

## 2018-08-11 DIAGNOSIS — R102 Pelvic and perineal pain: Secondary | ICD-10-CM

## 2018-08-11 DIAGNOSIS — N838 Other noninflammatory disorders of ovary, fallopian tube and broad ligament: Secondary | ICD-10-CM

## 2018-08-11 LAB — URINALYSIS, COMPLETE (UACMP) WITH MICROSCOPIC
Bacteria, UA: NONE SEEN
Bilirubin Urine: NEGATIVE
Glucose, UA: >=500 mg/dL — AB
Hgb urine dipstick: NEGATIVE
Ketones, ur: NEGATIVE mg/dL
Nitrite: NEGATIVE
Protein, ur: 100 mg/dL — AB
Specific Gravity, Urine: 1.031 — ABNORMAL HIGH (ref 1.005–1.030)
pH: 7 (ref 5.0–8.0)

## 2018-08-11 LAB — COMPREHENSIVE METABOLIC PANEL
ALT: 9 U/L (ref 0–44)
AST: 15 U/L (ref 15–41)
Albumin: 4 g/dL (ref 3.5–5.0)
Alkaline Phosphatase: 65 U/L (ref 38–126)
Anion gap: 10 (ref 5–15)
BUN: 9 mg/dL (ref 6–20)
CO2: 23 mmol/L (ref 22–32)
Calcium: 8.5 mg/dL — ABNORMAL LOW (ref 8.9–10.3)
Chloride: 104 mmol/L (ref 98–111)
Creatinine, Ser: 0.7 mg/dL (ref 0.44–1.00)
GFR calc Af Amer: >60 mL/min (ref >60)
GFR calc non Af Amer: >60 mL/min (ref >60)
Glucose, Bld: 232 mg/dL — ABNORMAL HIGH (ref 70–99)
Potassium: 3.7 mmol/L (ref 3.5–5.1)
Sodium: 137 mmol/L (ref 135–145)
Total Bilirubin: 0.4 mg/dL (ref 0.3–1.2)
Total Protein: 7.4 g/dL (ref 6.5–8.1)

## 2018-08-11 LAB — CBC
HCT: 36.7 % (ref 36.0–46.0)
Hemoglobin: 12.3 g/dL (ref 12.0–15.0)
MCH: 28.9 pg (ref 26.0–34.0)
MCHC: 33.5 g/dL (ref 30.0–36.0)
MCV: 86.4 fL (ref 80.0–100.0)
Platelets: 252 10*3/uL (ref 150–400)
RBC: 4.25 MIL/uL (ref 3.87–5.11)
RDW: 12.8 % (ref 11.5–15.5)
WBC: 13.2 10*3/uL — ABNORMAL HIGH (ref 4.0–10.5)
nRBC: 0 % (ref 0.0–0.2)

## 2018-08-11 LAB — LIPASE, BLOOD: Lipase: 32 U/L (ref 11–51)

## 2018-08-11 LAB — POCT PREGNANCY, URINE: Preg Test, Ur: NEGATIVE

## 2018-08-11 MED ORDER — IOPAMIDOL (ISOVUE-300) INJECTION 61%
30.0000 mL | Freq: Once | INTRAVENOUS | Status: AC | PRN
  Administered 2018-08-11: 30 mL via ORAL

## 2018-08-11 MED ORDER — SODIUM CHLORIDE 0.9 % IV BOLUS
1000.0000 mL | Freq: Once | INTRAVENOUS | Status: AC
  Administered 2018-08-11: 1000 mL via INTRAVENOUS

## 2018-08-11 MED ORDER — IOHEXOL 300 MG/ML  SOLN
100.0000 mL | Freq: Once | INTRAMUSCULAR | Status: AC | PRN
  Administered 2018-08-11: 100 mL via INTRAVENOUS

## 2018-08-11 MED ORDER — IOPAMIDOL (ISOVUE-300) INJECTION 61%: 30.0000 mL | Freq: Once | INTRAVENOUS | Status: DC

## 2018-08-11 MED ORDER — ONDANSETRON HCL 4 MG/2ML IJ SOLN
4.0000 mg | Freq: Once | INTRAMUSCULAR | Status: AC
  Administered 2018-08-11: 4 mg via INTRAVENOUS
  Filled 2018-08-11: qty 2

## 2018-08-11 MED ORDER — SODIUM CHLORIDE 0.9% FLUSH: 3.0000 mL | Freq: Once | INTRAVENOUS | Status: DC

## 2018-08-11 MED ORDER — MORPHINE SULFATE (PF) 4 MG/ML IV SOLN
4.0000 mg | Freq: Once | INTRAVENOUS | Status: AC
  Administered 2018-08-11: 4 mg via INTRAVENOUS
  Filled 2018-08-11: qty 1

## 2018-08-11 NOTE — ED Triage Notes (Signed)
Pt presents via POV c/o lower abd pain extending into back since this am. + nausea. Denies dysuria.

## 2018-08-11 NOTE — ED Provider Notes (Signed)
[facility] Emergency Department Provider Note  Time seen: 10:51 PM  I have reviewed the triage vital signs and the nursing notes.   HISTORY  Chief Complaint Abdominal Pain    HPI Helen Webster is a 43 y.o. female with a past medical history of diabetes, hypertension, migraines, presents to the emergency department for left lower quadrant abdominal pain.  According to the patient since yesterday morning she has been experiencing progressive abdominal pain especially in left lower quadrant.  States loose stool today as well.  Denies any fever, nausea, vomiting.  Denies chest pain or shortness of breath.  Denies dysuria or hematuria.  Denies vaginal bleeding or discharge.   Past Medical History:  Diagnosis Date  . Diabetes mellitus without complication (HCC)   . Hypertension   . Migraine     There are no active problems to display for this patient.   Past Surgical History:  Procedure Laterality Date  . CESAREAN SECTION      Prior to Admission medications   Medication Sig Start Date End Date Taking? Authorizing Provider  amLODipine (NORVASC) 5 MG tablet Take 1 tablet (5 mg total) by mouth daily. 02/24/17 02/24/18  Loleta Rose, MD  amoxicillin (AMOXIL) 875 MG tablet Take 1 tablet (875 mg total) by mouth 2 (two) times daily. 04/11/18   Fisher, Roselyn Bering, PA-C  benzonatate (TESSALON) 200 MG capsule Take 1 capsule (200 mg total) by mouth 3 (three) times daily as needed for cough. 04/11/18   Sherrie Mustache Roselyn Bering, PA-C  cyclobenzaprine (FLEXERIL) 5 MG tablet Take 1-2 tablets 3 times daily as needed 07/16/18   Enid Derry, PA-C  ibuprofen (ADVIL,MOTRIN) 600 MG tablet Take 1 tablet (600 mg total) by mouth every 6 (six) hours as needed. 07/16/18   Enid Derry, PA-C  lidocaine (LIDODERM) 5 % Place 1 patch onto the skin daily. Remove & Discard patch within 12 hours or as directed by MD 07/16/18   Enid Derry, PA-C  metFORMIN (GLUCOPHAGE) 500 MG tablet Take 1 tablet (500 mg  total) by mouth 2 (two) times daily with a meal. Patient taking differently: Take 500 mg daily with breakfast by mouth.  02/09/17 02/09/18  Rebecka Apley, MD  naproxen (NAPROSYN) 500 MG tablet Take 1 tablet (500 mg total) by mouth 2 (two) times daily with a meal. 08/11/17   Triplett, Cari B, FNP  tobramycin (TOBREX) 0.3 % ophthalmic solution Place 2 drops into both eyes every 4 (four) hours. 04/11/18   Faythe Ghee, PA-C    No Known Allergies  History reviewed. No pertinent family history.  Social History Social History   Tobacco Use  . Smoking status: Never Smoker  . Smokeless tobacco: Never Used  Substance Use Topics  . Alcohol use: No  . Drug use: No    Review of Systems Constitutional: Negative for fever Cardiovascular: Negative for chest pain. Respiratory: Negative for shortness of breath. Gastrointestinal: Lower abdominal pain.  Loose stool today.  Negative for nausea or vomiting. Genitourinary: Negative for urinary compaints.  Negative for vaginal bleeding or discharge. Skin: Negative for skin complaints  Neurological: Negative for headache All other ROS negative  ____________________________________________   PHYSICAL EXAM:  VITAL SIGNS: ED Triage Vitals  Enc Vitals Group     BP 08/11/18 1744 (!) 169/95     Pulse Rate 08/11/18 1744 95     Resp 08/11/18 1744 14     Temp 08/11/18 1744 98.9 F (37.2 C)     Temp Source 08/11/18 1744  Oral     SpO2 08/11/18 1744 99 %     Weight 08/11/18 1745 175 lb (79.4 kg)     Height --      Head Circumference --      Peak Flow --      Pain Score 08/11/18 1744 10     Pain Loc --      Pain Edu? --      Excl. in GC? --    Constitutional: Alert and oriented. Well appearing and in no distress. Eyes: Normal exam ENT   Head: Normocephalic and atraumatic.   Mouth/Throat: Mucous membranes are moist. Cardiovascular: Normal rate, regular rhythm. Respiratory: Normal respiratory effort without tachypnea nor retractions.  Breath sounds are clear Gastrointestinal: Soft, moderate left lower quadrant tenderness palpation.  No rebound guarding or distention.  Abdomen otherwise benign. Musculoskeletal: Nontender with normal range of motion in all extremities.  Neurologic:  Normal speech and language. No gross focal neurologic deficits  Skin:  Skin is warm, dry and intact.  Psychiatric: Mood and affect are normal.   ____________________________________________   RADIOLOGY  CT pending  ____________________________________________   INITIAL IMPRESSION / ASSESSMENT AND PLAN / ED COURSE  Pertinent labs & imaging results that were available during my care of the patient were reviewed by me and considered in my medical decision making (see chart for details).  Patient presents emergency department for left lower quadrant abdominal pain worsening since yesterday.  Dull aching pain described as a 10/10 currently.  Differential would include diverticulitis, colitis, UTI, pyelonephritis, ovarian pathology, ureterolithiasis.  We will check labs.  Patient's labs show a mild leukocytosis of 13,200.  Given moderate left lower quadrant tenderness will obtain CT imaging of the abdomen and pelvis to further evaluate.  Patient agreeable to plan of care.  We will dose pain and nausea medication as well as IV hydrate while awaiting CT results.  CT pending, patient care signed out to oncoming physician.  ____________________________________________   FINAL CLINICAL IMPRESSION(S) / ED DIAGNOSES  Abdominal pain   Minna Antis, MD 08/11/18 2254

## 2018-08-11 NOTE — ED Notes (Signed)
Patient transported to CT 

## 2018-08-11 NOTE — ED Notes (Signed)
Patient given up date regarding wait time. 

## 2018-08-12 ENCOUNTER — Emergency Department: Payer: BLUE CROSS/BLUE SHIELD

## 2018-08-12 MED ORDER — ONDANSETRON 4 MG PO TBDP: 4.0000 mg | ORAL_TABLET | Freq: Three times a day (TID) | ORAL | 0 refills | Status: DC | PRN

## 2018-08-12 MED ORDER — OXYCODONE-ACETAMINOPHEN 5-325 MG PO TABS: 1.0000 | ORAL_TABLET | ORAL | 0 refills | Status: DC | PRN

## 2018-08-12 MED ORDER — IBUPROFEN 800 MG PO TABS: 800.0000 mg | ORAL_TABLET | Freq: Three times a day (TID) | ORAL | 0 refills | Status: DC | PRN

## 2018-08-12 MED ORDER — OXYCODONE-ACETAMINOPHEN 5-325 MG PO TABS
1.0000 | ORAL_TABLET | Freq: Once | ORAL | Status: AC
  Administered 2018-08-12: 1 via ORAL
  Filled 2018-08-12: qty 1

## 2018-08-12 MED ORDER — FOSFOMYCIN TROMETHAMINE 3 G PO PACK
3.0000 g | PACK | Freq: Once | ORAL | Status: AC
  Administered 2018-08-12: 3 g via ORAL
  Filled 2018-08-12: qty 3

## 2018-08-12 NOTE — ED Provider Notes (Signed)
-----------------------------------------   12:10 AM on 08/12/2018 -----------------------------------------  CT notable for left adnexal cyst.  We will proceed with pelvic ultrasound for further characterization and to rule out torsion.   ----------------------------------------- 2:06 AM on 08/12/2018 -----------------------------------------  Updated patient on ultrasound results.  Looks like she has seen [facility] in the past.  Encouraged her to follow-up closely next week.  We will also provide local referral.  Will discharge home on NSAIDs and analgesia.  Strict return precautions given.  Patient verbalizes understanding and agrees with plan of care.   Irean Hong, MD 08/12/18 [phone]

## 2018-08-12 NOTE — ED Notes (Signed)
Pt given apple juice and graham crackers. MD aware.

## 2018-08-12 NOTE — Discharge Instructions (Addendum)
1.  You may take medicines as needed for pain and nausea (Motrin/Percocet/Zofran). 2.  You have been treated with a single dose of antibiotic for mild UTI. 3.  Return to the ER for worsening symptoms, persistent vomiting, difficulty breathing or other concerns.

## 2018-08-12 NOTE — ED Notes (Signed)
ED Provider at bedside. 

## 2018-08-12 NOTE — ED Notes (Signed)
Patient transported to Ultrasound 

## 2018-09-09 ENCOUNTER — Emergency Department
Admission: EM | Admit: 2018-09-09 | Discharge: 2018-09-09 | Disposition: A | Discharge status: Home / Self Care | Payer: BLUE CROSS/BLUE SHIELD | Attending: Emergency Medicine | Admitting: Emergency Medicine

## 2018-09-09 ENCOUNTER — Encounter: Payer: Self-pay | Admitting: Emergency Medicine

## 2018-09-09 ENCOUNTER — Other Ambulatory Visit: Payer: Self-pay

## 2018-09-09 DIAGNOSIS — Z79899 Other long term (current) drug therapy: Secondary | ICD-10-CM | POA: Diagnosis not present

## 2018-09-09 DIAGNOSIS — I10 Essential (primary) hypertension: Secondary | ICD-10-CM | POA: Diagnosis not present

## 2018-09-09 DIAGNOSIS — E119 Type 2 diabetes mellitus without complications: Secondary | ICD-10-CM | POA: Insufficient documentation

## 2018-09-09 DIAGNOSIS — M549 Dorsalgia, unspecified: Secondary | ICD-10-CM

## 2018-09-09 DIAGNOSIS — M5441 Lumbago with sciatica, right side: Secondary | ICD-10-CM | POA: Diagnosis not present

## 2018-09-09 DIAGNOSIS — Z7984 Long term (current) use of oral hypoglycemic drugs: Secondary | ICD-10-CM | POA: Insufficient documentation

## 2018-09-09 DIAGNOSIS — M545 Low back pain: Secondary | ICD-10-CM | POA: Diagnosis present

## 2018-09-09 MED ORDER — OXYCODONE-ACETAMINOPHEN 5-325 MG PO TABS
1.0000 | ORAL_TABLET | ORAL | Status: AC
  Administered 2018-09-09: 1 via ORAL
  Filled 2018-09-09: qty 1

## 2018-09-09 MED ORDER — PREDNISONE 20 MG PO TABS
40.0000 mg | ORAL_TABLET | Freq: Once | ORAL | Status: AC
  Administered 2018-09-09: 40 mg via ORAL
  Filled 2018-09-09: qty 2

## 2018-09-09 MED ORDER — KETOROLAC TROMETHAMINE 30 MG/ML IJ SOLN
30.0000 mg | Freq: Once | INTRAMUSCULAR | Status: AC
  Administered 2018-09-09: 30 mg via INTRAMUSCULAR
  Filled 2018-09-09: qty 1

## 2018-09-09 MED ORDER — CYCLOBENZAPRINE HCL 7.5 MG PO TABS: 7.5000 mg | ORAL_TABLET | Freq: Three times a day (TID) | ORAL | 0 refills | Status: DC | PRN

## 2018-09-09 MED ORDER — PREDNISONE 20 MG PO TABS: 40.0000 mg | ORAL_TABLET | Freq: Every day | ORAL | 0 refills | Status: DC

## 2018-09-09 NOTE — ED Provider Notes (Addendum)
[facility] Emergency Department Provider Note   ____________________________________________   First MD Initiated Contact with Patient 09/09/18 [phone]     (approximate)  I have reviewed the triage vital signs and the nursing notes.   HISTORY  Chief Complaint Back Pain    HPI Helen Webster is a 43 y.o. female here for evaluation of right lower back pain  Patient reports she is to push the more lawnmower the lawn yesterday as she was doing so she started to get pain in her right lower back that radiates out across towards her right groin and down the back of the right leg  It sharp.  Relieved by sitting in certain positions and rest.  Is worsened whenever she tries to twist or lift her right leg.  We discussed in detail, and she denies any abdominal pain at all.  There is no nausea no vomiting no fevers no chills.  No chest pain or shortness of breath.  No numbness tingling or weakness.  No trouble with bowel or bladder.  Pelvic pain.     Past Medical History:  Diagnosis Date  . Diabetes mellitus without complication (HCC)   . Hypertension   . Migraine     There are no active problems to display for this patient.   Past Surgical History:  Procedure Laterality Date  . CESAREAN SECTION      Prior to Admission medications   Medication Sig Start Date End Date Taking? Authorizing Provider  amLODipine (NORVASC) 5 MG tablet Take 1 tablet (5 mg total) by mouth daily. 02/24/17 02/24/18  Loleta Rose, MD  amoxicillin (AMOXIL) 875 MG tablet Take 1 tablet (875 mg total) by mouth 2 (two) times daily. 04/11/18   Fisher, Roselyn Bering, PA-C  benzonatate (TESSALON) 200 MG capsule Take 1 capsule (200 mg total) by mouth 3 (three) times daily as needed for cough. 04/11/18   Fisher, Roselyn Bering, PA-C  cyclobenzaprine (FEXMID) 7.5 MG tablet Take 1 tablet (7.5 mg total) by mouth 3 (three) times daily as needed for muscle spasms. 09/09/18   Sharyn Creamer, MD  ibuprofen (ADVIL,MOTRIN)  800 MG tablet Take 1 tablet (800 mg total) by mouth every 8 (eight) hours as needed for moderate pain. 08/12/18   Irean Hong, MD  lidocaine (LIDODERM) 5 % Place 1 patch onto the skin daily. Remove & Discard patch within 12 hours or as directed by MD 07/16/18   Enid Derry, PA-C  metFORMIN (GLUCOPHAGE) 500 MG tablet Take 1 tablet (500 mg total) by mouth 2 (two) times daily with a meal. Patient taking differently: Take 500 mg daily with breakfast by mouth.  02/09/17 02/09/18  Rebecka Apley, MD  naproxen (NAPROSYN) 500 MG tablet Take 1 tablet (500 mg total) by mouth 2 (two) times daily with a meal. 08/11/17   Triplett, Cari B, FNP  ondansetron (ZOFRAN ODT) 4 MG disintegrating tablet Take 1 tablet (4 mg total) by mouth every 8 (eight) hours as needed for nausea or vomiting. 08/12/18   Irean Hong, MD  oxyCODONE-acetaminophen (PERCOCET/ROXICET) 5-325 MG tablet Take 1 tablet by mouth every 4 (four) hours as needed for severe pain. 08/12/18   Irean Hong, MD  predniSONE (DELTASONE) 20 MG tablet Take 2 tablets (40 mg total) by mouth daily. 09/09/18   Sharyn Creamer, MD  tobramycin (TOBREX) 0.3 % ophthalmic solution Place 2 drops into both eyes every 4 (four) hours. 04/11/18   Faythe Ghee, PA-C    Allergies Patient has no  known allergies.  No family history on file.  Social History Social History   Tobacco Use  . Smoking status: Never Smoker  . Smokeless tobacco: Never Used  Substance Use Topics  . Alcohol use: No  . Drug use: No    Review of Systems Constitutional: No fever/chills Gastrointestinal: No abdominal pain.   Genitourinary: Negative for dysuria. Musculoskeletal: See HPI Gynecologic: Denies pregnancy previous hysterectomy Skin: Negative for rash. Neurological: Negative for headaches, areas of focal weakness or numbness.  There was no fall or injury  ____________________________________________   PHYSICAL EXAM:  VITAL SIGNS: ED Triage Vitals  Enc Vitals Group     BP  09/09/18 0741 (!) 153/105     Pulse Rate 09/09/18 0741 92     Resp 09/09/18 0741 16     Temp 09/09/18 0741 99.1 F (37.3 C)     Temp Source 09/09/18 0741 Oral     SpO2 09/09/18 0741 100 %     Weight 09/09/18 0742 160 lb (72.6 kg)     Height 09/09/18 0742  (1.626 m)     Head Circumference --      Peak Flow --      Pain Score 09/09/18 0742 10     Pain Loc --      Pain Edu? --      Excl. in GC? --     Constitutional: Alert and oriented. Well appearing and in no acute distress.  She is very pleasant.  When she tries to sit up or adjust she winces in pain puts her hand over her right lower back Eyes: Conjunctivae are normal. Head: Atraumatic. Nose: No congestion/rhinnorhea. Mouth/Throat: Mucous membranes are moist. Neck: No stridor.  Cardiovascular: Good peripheral circulation.  Normal capillary refill lower extremities. Respiratory: Normal respiratory effort.  No retractions. Lungs CTAB. Gastrointestinal: Soft and nontender. No distention.  Careful abdominal exam performed, no peritonitis in any quadrant.  No pain to McBurney's point.  Negative Murphy.  Patient reports she has no abdominal pain to any quadrant to palpation. Musculoskeletal: No lower extremity tenderness nor edema.  Patient has normal strength in all extremities.  Normal circulation, she does have limitation noted to extension and flexion at the right hip as it causes pain in her right posterior lower lumbar region that radiates out she reports across the buttock into the back of the right leg and also into the front of the right thigh.  The groin was examined with nursing staff present, there is no lesion or mass. Neurologic:  Normal speech and language. No gross focal neurologic deficits are appreciated.  There is no muscle weakness, there is limitation in strength at the hip flexors and extensors due to pain. Skin:  Skin is warm, dry and intact. No rash noted. Psychiatric: Mood and affect are normal. Speech and  behavior are normal.  ____________________________________________   LABS (all labs ordered are listed, but only abnormal results are displayed)  Labs Reviewed - No data to display ____________________________________________  EKG   ____________________________________________  RADIOLOGY  No fall or injury.  No evidence to support an immediate need for imaging.  No back pain red flags ____________________________________________   PROCEDURES  Procedure(s) performed: None  Procedures  Critical Care performed: No  ____________________________________________   INITIAL IMPRESSION / ASSESSMENT AND PLAN / ED COURSE  Pertinent labs & imaging results that were available during my care of the patient were reviewed by me and considered in my medical decision making (see chart for details).  Patient is for evaluation of atraumatic pain primarily over the sciatic region lower lumbar region radiating out towards her anterior upper thigh and right lower leg.  Very well-appearing.  No neurologic deficits on exam.  Fully awake and alert.  Strong distal peripheral pulses.  No bowel or bladder symptoms.  Seems consistent with musculoskeletal etiology, suspect possible sciatica which she reports has had similar symptoms on the left side in the past with sciatica.  Also muscle strains considered.  No signs that this would be acute abdominal or gynecologic or urologic in etiology.  Very reassuring clinical exam.    Back pain red flags reviewed, negative for: Trauma Unexplained Weight Loss Neurologic findings (includes bowel or bladder Incontinence or acute neurological weakness) Age >65 Fever Immunocompromised History of HIV, Tuberculosis, Cancer IV Drug Abuse -------  Patient is not driving self home.  Has been taking her home.  Return precautions and treatment recommendations and follow-up discussed with the patient who is agreeable with the plan.  Also instructed patient not to  drive within 8 hours of taking cyclobenzaprine.  ____________________________________________   FINAL CLINICAL IMPRESSION(S) / ED DIAGNOSES  Final diagnoses:  Acute right-sided low back pain with right-sided sciatica  Musculoskeletal back pain        Note:  This document was prepared using Dragon voice recognition software and may include unintentional dictation errors       Sharyn Creamer, MD 09/09/18 [phone]   Patient fully awake and alert, reports pain starting improved slightly.  Will give 1 additional Percocet prior to her departure, she is completely alert fully oriented at this time.  Does still have pain when moving in her right lower back region.  Husband will be driving her home.  Discussed prednisone use with her, she has used prednisone before without issues with her blood sugar she is a diabetic takes metformin only at this time.    Sharyn Creamer, MD 09/09/18 [phone]

## 2018-09-09 NOTE — Discharge Instructions (Addendum)

## 2018-09-09 NOTE — ED Triage Notes (Signed)
Presents with lower back pain  States she developed pain couple of days ago  And pain is moving into right groin area  Hx of sciatica

## 2018-12-31 ENCOUNTER — Emergency Department
Admission: EM | Admit: 2018-12-31 | Discharge: 2018-12-31 | Disposition: A | Discharge status: Home / Self Care | Payer: BLUE CROSS/BLUE SHIELD | Attending: Emergency Medicine | Admitting: Emergency Medicine

## 2018-12-31 ENCOUNTER — Other Ambulatory Visit: Payer: Self-pay

## 2018-12-31 ENCOUNTER — Encounter: Payer: Self-pay | Admitting: Emergency Medicine

## 2018-12-31 DIAGNOSIS — Z79899 Other long term (current) drug therapy: Secondary | ICD-10-CM | POA: Insufficient documentation

## 2018-12-31 DIAGNOSIS — G43909 Migraine, unspecified, not intractable, without status migrainosus: Secondary | ICD-10-CM | POA: Insufficient documentation

## 2018-12-31 DIAGNOSIS — Z7984 Long term (current) use of oral hypoglycemic drugs: Secondary | ICD-10-CM | POA: Insufficient documentation

## 2018-12-31 DIAGNOSIS — E119 Type 2 diabetes mellitus without complications: Secondary | ICD-10-CM | POA: Insufficient documentation

## 2018-12-31 DIAGNOSIS — R079 Chest pain, unspecified: Secondary | ICD-10-CM | POA: Insufficient documentation

## 2018-12-31 DIAGNOSIS — R519 Headache, unspecified: Secondary | ICD-10-CM

## 2018-12-31 DIAGNOSIS — I10 Essential (primary) hypertension: Secondary | ICD-10-CM | POA: Insufficient documentation

## 2018-12-31 LAB — COMPREHENSIVE METABOLIC PANEL
ALT: 10 U/L (ref 0–44)
AST: 14 U/L — ABNORMAL LOW (ref 15–41)
Albumin: 4.2 g/dL (ref 3.5–5.0)
Alkaline Phosphatase: 71 U/L (ref 38–126)
Anion gap: 10 (ref 5–15)
BUN: 8 mg/dL (ref 6–20)
CO2: 25 mmol/L (ref 22–32)
Calcium: 8.8 mg/dL — ABNORMAL LOW (ref 8.9–10.3)
Chloride: 99 mmol/L (ref 98–111)
Creatinine, Ser: 0.58 mg/dL (ref 0.44–1.00)
GFR calc Af Amer: >60 mL/min (ref >60)
GFR calc non Af Amer: >60 mL/min (ref >60)
Glucose, Bld: 307 mg/dL — ABNORMAL HIGH (ref 70–99)
Potassium: 3.7 mmol/L (ref 3.5–5.1)
Sodium: 134 mmol/L — ABNORMAL LOW (ref 135–145)
Total Bilirubin: 0.5 mg/dL (ref 0.3–1.2)
Total Protein: 7.8 g/dL (ref 6.5–8.1)

## 2018-12-31 LAB — CBC WITH DIFFERENTIAL/PLATELET
Abs Immature Granulocytes: 0.03 10*3/uL (ref 0.00–0.07)
Basophils Absolute: 0 10*3/uL (ref 0.0–0.1)
Basophils Relative: 0 %
Eosinophils Absolute: 0.1 10*3/uL (ref 0.0–0.5)
Eosinophils Relative: 1 %
HCT: 35.6 % — ABNORMAL LOW (ref 36.0–46.0)
Hemoglobin: 11.7 g/dL — ABNORMAL LOW (ref 12.0–15.0)
Immature Granulocytes: 0 %
Lymphocytes Relative: 28 %
Lymphs Abs: 3 10*3/uL (ref 0.7–4.0)
MCH: 27.6 pg (ref 26.0–34.0)
MCHC: 32.9 g/dL (ref 30.0–36.0)
MCV: 84 fL (ref 80.0–100.0)
Monocytes Absolute: 0.6 10*3/uL (ref 0.1–1.0)
Monocytes Relative: 5 %
Neutro Abs: 7 10*3/uL (ref 1.7–7.7)
Neutrophils Relative %: 66 %
Platelets: 262 10*3/uL (ref 150–400)
RBC: 4.24 MIL/uL (ref 3.87–5.11)
RDW: 12.2 % (ref 11.5–15.5)
WBC: 10.7 10*3/uL — ABNORMAL HIGH (ref 4.0–10.5)
nRBC: 0 % (ref 0.0–0.2)

## 2018-12-31 LAB — TROPONIN I (HIGH SENSITIVITY): Troponin I (High Sensitivity): 5 ng/L (ref <18)

## 2018-12-31 MED ORDER — SODIUM CHLORIDE 0.9 % IV BOLUS
1000.0000 mL | Freq: Once | INTRAVENOUS | Status: AC
  Administered 2018-12-31: 1000 mL via INTRAVENOUS

## 2018-12-31 MED ORDER — DIPHENHYDRAMINE HCL 50 MG/ML IJ SOLN
50.0000 mg | Freq: Once | INTRAMUSCULAR | Status: AC
  Administered 2018-12-31: 50 mg via INTRAVENOUS
  Filled 2018-12-31: qty 1

## 2018-12-31 MED ORDER — KETOROLAC TROMETHAMINE 30 MG/ML IJ SOLN
30.0000 mg | Freq: Once | INTRAMUSCULAR | Status: AC
  Administered 2018-12-31: 30 mg via INTRAVENOUS
  Filled 2018-12-31: qty 1

## 2018-12-31 MED ORDER — METOCLOPRAMIDE HCL 5 MG/ML IJ SOLN
10.0000 mg | Freq: Once | INTRAMUSCULAR | Status: AC
  Administered 2018-12-31: 10 mg via INTRAVENOUS
  Filled 2018-12-31: qty 2

## 2018-12-31 NOTE — ED Triage Notes (Signed)
Patient ambulatory to triage with steady gait, without difficulty or distress noted; pt reports migraine "all over" all day yesterday; since 8pm having left sided CP; denies any accomp symptoms

## 2018-12-31 NOTE — ED Provider Notes (Signed)
[facility] Emergency Department Provider Note  Time seen: 7:14 AM  I have reviewed the triage vital signs and the nursing notes.   HISTORY  Chief Complaint Chest Pain   HPI Helen Webster is a 43 y.o. female with a past medical history of diabetes, hypertension, migraines, presents to the emergency department for migraine headache and chest pain.  According to the patient since yesterday she has been experiencing a migraine, states her father just passed away, she believes the migraine is likely related.  Patient also states since 8 PM last night she has been having intermittent chest pain.  Denies any shortness of breath cough congestion or fever.  Describes her headache as a 10/10 in severity, global headache.  States nausea, which she states is typical of her migraines.   Past Medical History:  Diagnosis Date  . Diabetes mellitus without complication ([place])   . Hypertension   . Migraine     There are no active problems to display for this patient.   Past Surgical History:  Procedure Laterality Date  . CESAREAN SECTION      Prior to Admission medications   Medication Sig Start Date End Date Taking? Authorizing Provider  amLODipine (NORVASC) 5 MG tablet Take 1 tablet (5 mg total) by mouth daily. 02/24/17 02/24/18  Hinda Kehr, MD  amoxicillin (AMOXIL) 875 MG tablet Take 1 tablet (875 mg total) by mouth 2 (two) times daily. 04/11/18   Fisher, Linden Dolin, PA-C  benzonatate (TESSALON) 200 MG capsule Take 1 capsule (200 mg total) by mouth 3 (three) times daily as needed for cough. 04/11/18   Fisher, Linden Dolin, PA-C  cyclobenzaprine (FEXMID) 7.5 MG tablet Take 1 tablet (7.5 mg total) by mouth 3 (three) times daily as needed for muscle spasms. 09/09/18   Delman Kitten, MD  ibuprofen (ADVIL,MOTRIN) 800 MG tablet Take 1 tablet (800 mg total) by mouth every 8 (eight) hours as needed for moderate pain. 08/12/18   Paulette Blanch, MD  lidocaine (LIDODERM) 5 % Place 1 patch onto the  skin daily. Remove & Discard patch within 12 hours or as directed by MD 07/16/18   Laban Emperor, PA-C  metFORMIN (GLUCOPHAGE) 500 MG tablet Take 1 tablet (500 mg total) by mouth 2 (two) times daily with a meal. Patient taking differently: Take 500 mg daily with breakfast by mouth.  02/09/17 02/09/18  Loney Hering, MD  naproxen (NAPROSYN) 500 MG tablet Take 1 tablet (500 mg total) by mouth 2 (two) times daily with a meal. 08/11/17   Triplett, Cari B, FNP  ondansetron (ZOFRAN ODT) 4 MG disintegrating tablet Take 1 tablet (4 mg total) by mouth every 8 (eight) hours as needed for nausea or vomiting. 08/12/18   Paulette Blanch, MD  oxyCODONE-acetaminophen (PERCOCET/ROXICET) 5-325 MG tablet Take 1 tablet by mouth every 4 (four) hours as needed for severe pain. 08/12/18   Paulette Blanch, MD  predniSONE (DELTASONE) 20 MG tablet Take 2 tablets (40 mg total) by mouth daily. 09/09/18   Delman Kitten, MD  tobramycin (TOBREX) 0.3 % ophthalmic solution Place 2 drops into both eyes every 4 (four) hours. 04/11/18   Versie Starks, PA-C    No Known Allergies  No family history on file.  Social History Social History   Tobacco Use  . Smoking status: Never Smoker  . Smokeless tobacco: Never Used  Substance Use Topics  . Alcohol use: No  . Drug use: No    Review of Systems Constitutional: Negative  for fever. Cardiovascular: Negative for chest pain. Respiratory: Negative for shortness of breath. Gastrointestinal: Negative for abdominal pain.  Positive for nausea but negative for vomiting. Musculoskeletal: Negative for musculoskeletal complaints Skin: Negative for skin complaints  Neurological: Significant headache. All other ROS negative  ____________________________________________   PHYSICAL EXAM:  VITAL SIGNS: ED Triage Vitals  Enc Vitals Group     BP 12/31/18 0507 (!) 171/111     Pulse Rate 12/31/18 0507 92     Resp 12/31/18 0507 18     Temp 12/31/18 0507 99.1 F (37.3 C)     Temp Source  12/31/18 0507 Oral     SpO2 12/31/18 0507 97 %     Weight --      Height --      Head Circumference --      Peak Flow --      Pain Score 12/31/18 0508 10     Pain Loc --      Pain Edu? --      Excl. in GC? --    Constitutional: Alert and oriented. Well appearing and in no distress. Eyes: Normal exam ENT      Head: Normocephalic and atraumatic.      Mouth/Throat: Mucous membranes are moist. Cardiovascular: Normal rate, regular rhythm. No murmur Respiratory: Normal respiratory effort without tachypnea nor retractions. Breath sounds are clear Gastrointestinal: Soft and nontender. No distention. Musculoskeletal: Nontender with normal range of motion in all extremities Neurologic:  Normal speech and language. No gross focal neurologic deficits Skin:  Skin is warm, dry and intact.  Psychiatric: Mood and affect are normal.   ____________________________________________    EKG  EKG viewed and interpreted by myself shows a normal sinus rhythm 86 bpm with a narrow QRS, normal axis, normal intervals, no concerning ST changes.  ____________________________________________   INITIAL IMPRESSION / ASSESSMENT AND PLAN / ED COURSE  Pertinent labs & imaging results that were available during my care of the patient were reviewed by me and considered in my medical decision making (see chart for details).   Patient presents to the emergency department for migraine headache since yesterday, and intermittent chest pain since 8 PM.  Overall the patient appears well, no acute distress, normal physical exam.  Reassuringly patient's lab work including her cardiac enzymes are normal.  EKG appears normal as well.  Patient continues to state a migraine headache however 10 out of 10 in severity along with nausea which she states is typical of her migraine headaches.  We will treat with Toradol Reglan Benadryl and IV fluids.  We will reassess after medications.  Patient states the headache is now gone.  We  will discharge the patient home with PCP follow-up.  I discussed return precautions.  Helen Webster was evaluated in Emergency Department on 12/31/2018 for the symptoms described in the history of present illness. She was evaluated in the context of the global COVID-19 pandemic, which necessitated consideration that the patient might be at risk for infection with the SARS-CoV-2 virus that causes COVID-19. Institutional protocols and algorithms that pertain to the evaluation of patients at risk for COVID-19 are in a state of rapid change based on information released by regulatory bodies including the CDC and federal and state organizations. These policies and algorithms were followed during the patient's care in the ED.  ____________________________________________   FINAL CLINICAL IMPRESSION(S) / ED DIAGNOSES  Migraine headache Chest pain   Minna AntisPaduchowski, Beauregard Jarrells, MD 12/31/18 0900

## 2019-01-18 ENCOUNTER — Other Ambulatory Visit: Payer: Self-pay

## 2019-01-18 ENCOUNTER — Encounter (HOSPITAL_BASED_OUTPATIENT_CLINIC_OR_DEPARTMENT_OTHER): Payer: Self-pay

## 2019-01-18 ENCOUNTER — Emergency Department (HOSPITAL_BASED_OUTPATIENT_CLINIC_OR_DEPARTMENT_OTHER)
Admission: EM | Admit: 2019-01-18 | Discharge: 2019-01-18 | Disposition: A | Discharge status: Home / Self Care | Payer: Self-pay | Attending: Emergency Medicine | Admitting: Emergency Medicine

## 2019-01-18 DIAGNOSIS — R42 Dizziness and giddiness: Secondary | ICD-10-CM | POA: Insufficient documentation

## 2019-01-18 DIAGNOSIS — Z79899 Other long term (current) drug therapy: Secondary | ICD-10-CM | POA: Insufficient documentation

## 2019-01-18 DIAGNOSIS — Z7984 Long term (current) use of oral hypoglycemic drugs: Secondary | ICD-10-CM | POA: Insufficient documentation

## 2019-01-18 DIAGNOSIS — R55 Syncope and collapse: Secondary | ICD-10-CM | POA: Insufficient documentation

## 2019-01-18 DIAGNOSIS — I10 Essential (primary) hypertension: Secondary | ICD-10-CM | POA: Insufficient documentation

## 2019-01-18 DIAGNOSIS — E119 Type 2 diabetes mellitus without complications: Secondary | ICD-10-CM | POA: Insufficient documentation

## 2019-01-18 DIAGNOSIS — G43001 Migraine without aura, not intractable, with status migrainosus: Secondary | ICD-10-CM | POA: Insufficient documentation

## 2019-01-18 LAB — CBC WITH DIFFERENTIAL/PLATELET
Abs Immature Granulocytes: 0.04 10*3/uL (ref 0.00–0.07)
Basophils Absolute: 0 10*3/uL (ref 0.0–0.1)
Basophils Relative: 0 %
Eosinophils Absolute: 0.1 10*3/uL (ref 0.0–0.5)
Eosinophils Relative: 1 %
HCT: 36.5 % (ref 36.0–46.0)
Hemoglobin: 11.8 g/dL — ABNORMAL LOW (ref 12.0–15.0)
Immature Granulocytes: 0 %
Lymphocytes Relative: 24 %
Lymphs Abs: 3.1 10*3/uL (ref 0.7–4.0)
MCH: 27.4 pg (ref 26.0–34.0)
MCHC: 32.3 g/dL (ref 30.0–36.0)
MCV: 84.7 fL (ref 80.0–100.0)
Monocytes Absolute: 0.8 10*3/uL (ref 0.1–1.0)
Monocytes Relative: 6 %
Neutro Abs: 8.8 10*3/uL — ABNORMAL HIGH (ref 1.7–7.7)
Neutrophils Relative %: 69 %
Platelets: 261 10*3/uL (ref 150–400)
RBC: 4.31 MIL/uL (ref 3.87–5.11)
RDW: 12 % (ref 11.5–15.5)
WBC: 12.9 10*3/uL — ABNORMAL HIGH (ref 4.0–10.5)
nRBC: 0 % (ref 0.0–0.2)

## 2019-01-18 LAB — BASIC METABOLIC PANEL
Anion gap: 10 (ref 5–15)
BUN: 7 mg/dL (ref 6–20)
CO2: 25 mmol/L (ref 22–32)
Calcium: 9.2 mg/dL (ref 8.9–10.3)
Chloride: 99 mmol/L (ref 98–111)
Creatinine, Ser: 0.57 mg/dL (ref 0.44–1.00)
GFR calc Af Amer: >60 mL/min (ref >60)
GFR calc non Af Amer: >60 mL/min (ref >60)
Glucose, Bld: 247 mg/dL — ABNORMAL HIGH (ref 70–99)
Potassium: 3.6 mmol/L (ref 3.5–5.1)
Sodium: 134 mmol/L — ABNORMAL LOW (ref 135–145)

## 2019-01-18 LAB — PREGNANCY, URINE: Preg Test, Ur: NEGATIVE

## 2019-01-18 MED ORDER — KETOROLAC TROMETHAMINE 30 MG/ML IJ SOLN
15.0000 mg | Freq: Once | INTRAMUSCULAR | Status: AC
  Administered 2019-01-18: 15 mg via INTRAVENOUS
  Filled 2019-01-18: qty 1

## 2019-01-18 MED ORDER — METOCLOPRAMIDE HCL 5 MG/ML IJ SOLN
10.0000 mg | Freq: Once | INTRAMUSCULAR | Status: AC
  Administered 2019-01-18: 03:00:00 10 mg via INTRAVENOUS
  Filled 2019-01-18: qty 2

## 2019-01-18 NOTE — ED Provider Notes (Signed)
[facility] EMERGENCY DEPARTMENT Provider Note   CSN: [id number] Arrival date & time: 01/18/19  0217     History   Chief Complaint Chief Complaint  Patient presents with  . Loss of Consciousness    HPI Helen Webster is a 43 y.o. female.     HPI  43 year old female comes into the ER with chief complaint of loss of consciousness.  Patient has history of diabetes, hypertension and migraine.  Helen Webster reports that Helen Webster has been having migrainous headaches and was at work when Helen Webster started having dizzy spells.  Ultimately Helen Webster had a fainting spell that led to the work calling 911.  Patient had been having some dizziness prior to the episode of fainting.  Helen Webster denies any associated chest pain, shortness of breath, palpitations, neck pain.  Her headaches are generalized and similar to her migrainous headache.  Helen Webster has associated photophobia, phonophobia and nausea.  Patient does not think Helen Webster is pregnant.  Helen Webster is on her period and denies any heavy blood loss.  Pt has no hx of PE, DVT and denies any exogenous hormone (testosterone / estrogen) use, long distance travels or surgery in the past 6 weeks, active cancer, recent immobilization.   Past Medical History:  Diagnosis Date  . Diabetes mellitus without complication (HCC)   . Hypertension   . Migraine     There are no active problems to display for this patient.   Past Surgical History:  Procedure Laterality Date  . CESAREAN SECTION       OB History   No obstetric history on file.      Home Medications    Prior to Admission medications   Medication Sig Start Date End Date Taking? Authorizing Provider  amLODipine (NORVASC) 5 MG tablet Take 1 tablet (5 mg total) by mouth daily. 02/24/17 02/24/18  Loleta RoseForbach, Cory, MD  amoxicillin (AMOXIL) 875 MG tablet Take 1 tablet (875 mg total) by mouth 2 (two) times daily. 04/11/18   Fisher, Roselyn BeringSusan W, PA-C  benzonatate (TESSALON) 200 MG capsule Take 1 capsule (200 mg total) by mouth 3  (three) times daily as needed for cough. 04/11/18   Fisher, Roselyn BeringSusan W, PA-C  cyclobenzaprine (FEXMID) 7.5 MG tablet Take 1 tablet (7.5 mg total) by mouth 3 (three) times daily as needed for muscle spasms. 09/09/18   Sharyn CreamerQuale, Mark, MD  ibuprofen (ADVIL,MOTRIN) 800 MG tablet Take 1 tablet (800 mg total) by mouth every 8 (eight) hours as needed for moderate pain. 08/12/18   Irean HongSung, Jade J, MD  lidocaine (LIDODERM) 5 % Place 1 patch onto the skin daily. Remove & Discard patch within 12 hours or as directed by MD 07/16/18   Enid DerryWagner, Ashley, PA-C  metFORMIN (GLUCOPHAGE) 500 MG tablet Take 1 tablet (500 mg total) by mouth 2 (two) times daily with a meal. Patient taking differently: Take 500 mg daily with breakfast by mouth.  02/09/17 02/09/18  Rebecka ApleyWebster, Allison P, MD  naproxen (NAPROSYN) 500 MG tablet Take 1 tablet (500 mg total) by mouth 2 (two) times daily with a meal. 08/11/17   Triplett, Cari B, FNP  ondansetron (ZOFRAN ODT) 4 MG disintegrating tablet Take 1 tablet (4 mg total) by mouth every 8 (eight) hours as needed for nausea or vomiting. 08/12/18   Irean HongSung, Jade J, MD  oxyCODONE-acetaminophen (PERCOCET/ROXICET) 5-325 MG tablet Take 1 tablet by mouth every 4 (four) hours as needed for severe pain. 08/12/18   Irean HongSung, Jade J, MD  predniSONE (DELTASONE) 20 MG tablet Take 2 tablets (40  mg total) by mouth daily. 09/09/18   Sharyn CreamerQuale, Mark, MD  tobramycin (TOBREX) 0.3 % ophthalmic solution Place 2 drops into both eyes every 4 (four) hours. 04/11/18   Faythe GheeFisher, Susan W, PA-C    Family History No family history on file.  Social History Social History   Tobacco Use  . Smoking status: Never Smoker  . Smokeless tobacco: Never Used  Substance Use Topics  . Alcohol use: No  . Drug use: No     Allergies   Patient has no known allergies.   Review of Systems Review of Systems  Constitutional: Positive for activity change.  Respiratory: Negative for shortness of breath.   Cardiovascular: Negative for chest pain and palpitations.   Neurological: Positive for syncope. Negative for dizziness and headaches.  All other systems reviewed and are negative.    Physical Exam Updated Vital Signs BP (!) 146/93   Pulse 81   Temp 98.2 F (36.8 C) (Oral)   Resp (!) 23   Ht 5\' 5"  (1.651 m)   Wt 79.4 kg   LMP 01/18/2019   SpO2 100%   BMI 29.12 kg/m   Physical Exam Vitals signs and nursing note reviewed.  Constitutional:      Appearance: Helen Webster is well-developed.  HENT:     Head: Normocephalic and atraumatic.  Eyes:     Extraocular Movements: Extraocular movements intact.     Pupils: Pupils are equal, round, and reactive to light.     Comments: No nystagmus, no meningismus  Neck:     Musculoskeletal: Neck supple.  Cardiovascular:     Rate and Rhythm: Normal rate and regular rhythm.     Heart sounds: Normal heart sounds. No murmur.  Pulmonary:     Effort: Pulmonary effort is normal. No respiratory distress.  Abdominal:     General: There is no distension.     Palpations: Abdomen is soft.     Tenderness: There is no abdominal tenderness. There is no guarding or rebound.  Musculoskeletal:     Right lower leg: No edema.     Left lower leg: No edema.  Skin:    General: Skin is warm and dry.  Neurological:     Mental Status: Helen Webster is alert and oriented to person, place, and time.      ED Treatments / Results  Labs (all labs ordered are listed, but only abnormal results are displayed) Labs Reviewed  BASIC METABOLIC PANEL - Abnormal; Notable for the following components:      Result Value   Sodium 134 (*)    Glucose, Bld 247 (*)    All other components within normal limits  CBC WITH DIFFERENTIAL/PLATELET - Abnormal; Notable for the following components:   WBC 12.9 (*)    Hemoglobin 11.8 (*)    Neutro Abs 8.8 (*)    All other components within normal limits  PREGNANCY, URINE    EKG EKG Interpretation  Date/Time:  Saturday January 18 2019 02:29:51 EDT Ventricular Rate:  83 PR Interval:    QRS Duration:  89 QT Interval:  378 QTC Calculation: 445 R Axis:   72 Text Interpretation:  Sinus rhythm No acute changes No significant change since last tracing Confirmed by Derwood KaplanNanavati, Isaia Hassell ([id number]) on 01/18/2019 4:14:30 AM   Radiology No results found.  Procedures Procedures (including critical care time)  Medications Ordered in ED Medications  metoCLOPramide (REGLAN) injection 10 mg (10 mg Intravenous Given 01/18/19 0325)  ketorolac (TORADOL) 30 MG/ML injection 15 mg (15 mg Intravenous Given  01/18/19 0326)     Initial Impression / Assessment and Plan / ED Course  I have reviewed the triage vital signs and the nursing notes.  Pertinent labs & imaging results that were available during my care of the patient were reviewed by me and considered in my medical decision making (see chart for details).       Patient comes into the ER after a syncopal episode.  DDx includes: Orthostatic hypotension Stroke Vertebral artery dissection/stenosis Dysrhythmia PE Vasovagal/neurocardiogenic syncope Aortic stenosis Valvular disorder/Cardiomyopathy Anemia   Helen Webster complains of headaches, otherwise has no cardiac prodrome.  Helen Webster has history of migraines and reports that her headaches are similar to her prior headaches.  Helen Webster has associated symptoms that are consistent with migraine as well.  Doubt that Helen Webster has brain aneurysm or dissection.  Patient has well score of 0 and Helen Webster is PERC negative.   Basic labs are fine.  EKG is reassuring. We will discharge her with strict ER return precautions. Patient is comfortable with the plan.  Final Clinical Impressions(s) / ED Diagnoses   Final diagnoses:  Syncope and collapse  Migraine without aura and with status migrainosus, not intractable    ED Discharge Orders    None       Varney Biles, MD 01/18/19 [phone]

## 2019-01-18 NOTE — ED Notes (Signed)
ED Provider at bedside. 

## 2019-01-18 NOTE — ED Triage Notes (Signed)
Pt was at work, had syncopal episode at desk.  Reports migraine at this time. 10/10. Has taken Fioricet in the past.

## 2019-01-18 NOTE — Discharge Instructions (Signed)
Please return to the ER if the headache gets severe and in not improving, you have associated new one sided numbness, tingling, weakness or confusion, seizures, poor balance or poor vision.  Also return to the ER if you have repeat episodes of fainting or you start noticing severe shortness of breath with minimal exertion or feel like you are going to pass out with minimal exertion.  [place] law prevents people with seizures or fainting from driving or operating dangerous machinery until they are free of seizures or fainting for 6 months.

## 2019-02-11 ENCOUNTER — Other Ambulatory Visit: Payer: Self-pay

## 2019-02-11 DIAGNOSIS — Z20822 Contact with and (suspected) exposure to covid-19: Secondary | ICD-10-CM

## 2019-02-12 LAB — NOVEL CORONAVIRUS, NAA: SARS-CoV-2, NAA: DETECTED — AB

## 2019-03-31 ENCOUNTER — Encounter: Payer: Self-pay | Admitting: Certified Nurse Midwife

## 2019-04-15 ENCOUNTER — Encounter: Payer: Self-pay | Admitting: Emergency Medicine

## 2019-04-15 ENCOUNTER — Emergency Department: Payer: Self-pay

## 2019-04-15 ENCOUNTER — Other Ambulatory Visit: Payer: Self-pay

## 2019-04-15 ENCOUNTER — Emergency Department
Admission: EM | Admit: 2019-04-15 | Discharge: 2019-04-15 | Disposition: A | Discharge status: Home / Self Care | Payer: Self-pay | Attending: Emergency Medicine | Admitting: Emergency Medicine

## 2019-04-15 DIAGNOSIS — Z79899 Other long term (current) drug therapy: Secondary | ICD-10-CM | POA: Insufficient documentation

## 2019-04-15 DIAGNOSIS — E119 Type 2 diabetes mellitus without complications: Secondary | ICD-10-CM | POA: Insufficient documentation

## 2019-04-15 DIAGNOSIS — I10 Essential (primary) hypertension: Secondary | ICD-10-CM | POA: Insufficient documentation

## 2019-04-15 DIAGNOSIS — Z20828 Contact with and (suspected) exposure to other viral communicable diseases: Secondary | ICD-10-CM | POA: Insufficient documentation

## 2019-04-15 DIAGNOSIS — J9801 Acute bronchospasm: Secondary | ICD-10-CM | POA: Insufficient documentation

## 2019-04-15 MED ORDER — METHYLPREDNISOLONE 4 MG PO TBPK: ORAL_TABLET | ORAL | 0 refills | Status: DC

## 2019-04-15 MED ORDER — HYDROCOD POLST-CPM POLST ER 10-8 MG/5ML PO SUER: 5.0000 mL | Freq: Two times a day (BID) | ORAL | 0 refills | Status: DC

## 2019-04-15 NOTE — ED Triage Notes (Signed)
[facility] with congestion for the past 10 days. States she was dx with sinus infection. Pt states she had covid in august.

## 2019-04-15 NOTE — ED Provider Notes (Signed)
[facility] Emergency Department Provider Note   ____________________________________________   First MD Initiated Contact with Patient 04/15/19 0901     (approximate)  I have reviewed the triage vital signs and the nursing notes.   HISTORY  Chief Complaint URI    HPI Pedro EarlsMia Chestine SporeClark is a 43 y.o. female patient complain of productive cough for 10 days.  Patient was diagnosed with a sinus infection placed on antibiotics last week.  Patient also had a positive Covid test in August 2020.  Patient denies fever or chills with this complaint.  Patient is aware of her elevated blood pressure.  Patient denies fatigue, nausea, vomiting, diarrhea.  Patient states no sore throat.  Denies recent travel or known contact COVID-19.  Patient denies pain.  No current palliative measure for complaint.         Past Medical History:  Diagnosis Date  . Diabetes mellitus without complication (HCC)   . Hypertension   . Migraine     There are no active problems to display for this patient.   Past Surgical History:  Procedure Laterality Date  . CESAREAN SECTION      Prior to Admission medications   Medication Sig Start Date End Date Taking? Authorizing Provider  amLODipine (NORVASC) 5 MG tablet Take 1 tablet (5 mg total) by mouth daily. 02/24/17 02/24/18  Loleta RoseForbach, Cory, MD  amoxicillin (AMOXIL) 875 MG tablet Take 1 tablet (875 mg total) by mouth 2 (two) times daily. 04/11/18   Fisher, Roselyn BeringSusan W, PA-C  benzonatate (TESSALON) 200 MG capsule Take 1 capsule (200 mg total) by mouth 3 (three) times daily as needed for cough. 04/11/18   Fisher, Roselyn BeringSusan W, PA-C  chlorpheniramine-HYDROcodone (TUSSIONEX PENNKINETIC ER) 10-8 MG/5ML SUER Take 5 mLs by mouth 2 (two) times daily. 04/15/19   Joni ReiningSmith, Lilibeth Opie K, PA-C  cyclobenzaprine (FEXMID) 7.5 MG tablet Take 1 tablet (7.5 mg total) by mouth 3 (three) times daily as needed for muscle spasms. 09/09/18   Sharyn CreamerQuale, Mark, MD  ibuprofen (ADVIL,MOTRIN) 800  MG tablet Take 1 tablet (800 mg total) by mouth every 8 (eight) hours as needed for moderate pain. 08/12/18   Irean HongSung, Jade J, MD  lidocaine (LIDODERM) 5 % Place 1 patch onto the skin daily. Remove & Discard patch within 12 hours or as directed by MD 07/16/18   Enid DerryWagner, Ashley, PA-C  metFORMIN (GLUCOPHAGE) 500 MG tablet Take 1 tablet (500 mg total) by mouth 2 (two) times daily with a meal. Patient taking differently: Take 500 mg daily with breakfast by mouth.  02/09/17 02/09/18  Rebecka ApleyWebster, Allison P, MD  methylPREDNISolone (MEDROL DOSEPAK) 4 MG TBPK tablet Take Tapered dose as directed 04/15/19   Joni ReiningSmith, Andras Grunewald K, PA-C  naproxen (NAPROSYN) 500 MG tablet Take 1 tablet (500 mg total) by mouth 2 (two) times daily with a meal. 08/11/17   Triplett, Cari B, FNP  ondansetron (ZOFRAN ODT) 4 MG disintegrating tablet Take 1 tablet (4 mg total) by mouth every 8 (eight) hours as needed for nausea or vomiting. 08/12/18   Irean HongSung, Jade J, MD  tobramycin (TOBREX) 0.3 % ophthalmic solution Place 2 drops into both eyes every 4 (four) hours. 04/11/18   Faythe GheeFisher, Susan W, PA-C    Allergies Patient has no known allergies.  No family history on file.  Social History Social History   Tobacco Use  . Smoking status: Never Smoker  . Smokeless tobacco: Never Used  Substance Use Topics  . Alcohol use: No  . Drug use: No  Review of Systems  Constitutional: No fever/chills Eyes: No visual changes. ENT: No sore throat.  Sinus congestion Cardiovascular: Denies chest pain. Respiratory: Denies shortness of breath.  Ducted cough Gastrointestinal: No abdominal pain.  No nausea, no vomiting.  No diarrhea.  No constipation. Genitourinary: Negative for dysuria. Musculoskeletal: Negative for back pain. Skin: Negative for rash. Neurological: Negative for headaches, focal weakness or numbness.   ____________________________________________   PHYSICAL EXAM:  VITAL SIGNS: ED Triage Vitals [04/15/19 0850]  Enc Vitals Group     BP  (!) 161/106     Pulse Rate 97     Resp 16     Temp 98.6 F (37 C)     Temp Source Oral     SpO2 98 %     Weight 180 lb (81.6 kg)     Height 5\' 5"  (1.651 m)     Head Circumference      Peak Flow      Pain Score 0     Pain Loc      Pain Edu?      Excl. in GC?    Constitutional: Alert and oriented. Well appearing and in no acute distress. Nose: Edematous nasal turbinates clear rhinorrhea Mouth/Throat: Mucous membranes are moist.  Oropharynx non-erythematous. Neck: No stridor.   Hematological/Lymphatic/Immunilogical: No cervical lymphadenopathy. Cardiovascular: Normal rate, regular rhythm. Grossly normal heart sounds.  Good peripheral circulation. Respiratory: Normal respiratory effort.  No retractions. Lungs CTAB. Skin:  Skin is warm, dry and intact. No rash noted. Psychiatric: Mood and affect are normal. Speech and behavior are normal.  ____________________________________________   LABS (all labs ordered are listed, but only abnormal results are displayed)  Labs Reviewed - No data to display ____________________________________________  EKG   ____________________________________________  RADIOLOGY  ED MD interpretation:    Official radiology report(s): Dg Chest Portable 1 View  Result Date: 04/15/2019 CLINICAL DATA:  Productive cough. EXAM: PORTABLE CHEST 1 VIEW COMPARISON:  07/12/2016. FINDINGS: Mediastinum and hilar structures normal. Cardiomegaly with normal pulmonary vascularity. No focal infiltrate. No pleural effusion or pneumothorax. No acute bony abnormality. IMPRESSION: Stable cardiomegaly. No pulmonary venous congestion. No acute infiltrate. Electronically Signed   By: 07/14/2016  Register   On: 04/15/2019 09:34    ____________________________________________   PROCEDURES  Procedure(s) performed (including Critical Care):  Procedures   ____________________________________________   INITIAL IMPRESSION / ASSESSMENT AND PLAN / ED COURSE  As part of my  medical decision making, I reviewed the following data within the electronic MEDICAL RECORD NUMBER         Kamaile Zachow was evaluated in Emergency Department on 04/15/2019 for the symptoms described in the history of present illness. She was evaluated in the context of the global COVID-19 pandemic, which necessitated consideration that the patient might be at risk for infection with the SARS-CoV-2 virus that causes COVID-19. Institutional protocols and algorithms that pertain to the evaluation of patients at risk for COVID-19 are in a state of rapid change based on information released by regulatory bodies including the CDC and federal and state organizations. These policies and algorithms were followed during the patient's care in the ED.  Patient presents with persistent productive cough for 10 days.  Patient recently completed course of antibiotics sinusitis.  Discussed x-ray findings with patient.  Patient given discharge care instruction advised take medication as directed.      ____________________________________________   FINAL CLINICAL IMPRESSION(S) / ED DIAGNOSES  Final diagnoses:  Cough due to bronchospasm     ED Discharge Orders  Ordered    methylPREDNISolone (MEDROL DOSEPAK) 4 MG TBPK tablet     04/15/19 1035    chlorpheniramine-HYDROcodone (TUSSIONEX PENNKINETIC ER) 10-8 MG/5ML SUER  2 times daily     04/15/19 1035           Note:  This document was prepared using Dragon voice recognition software and may include unintentional dictation errors.    Sable Feil, PA-C 04/15/19 1037    Earleen Newport, MD 04/15/19 1038

## 2019-04-15 NOTE — ED Notes (Signed)
See triage note  Presents with cough   Stats she was seen by UC for sinus infection  Placed on antibiotics and nasal spray  conts to have prod cough  No fever

## 2019-07-01 ENCOUNTER — Encounter: Payer: Self-pay | Admitting: Emergency Medicine

## 2019-07-01 ENCOUNTER — Emergency Department
Admission: EM | Admit: 2019-07-01 | Discharge: 2019-07-02 | Disposition: A | Discharge status: Home / Self Care | Payer: Self-pay | Attending: Emergency Medicine | Admitting: Emergency Medicine

## 2019-07-01 ENCOUNTER — Other Ambulatory Visit: Payer: Self-pay

## 2019-07-01 DIAGNOSIS — Y999 Unspecified external cause status: Secondary | ICD-10-CM | POA: Insufficient documentation

## 2019-07-01 DIAGNOSIS — Z8616 Personal history of COVID-19: Secondary | ICD-10-CM | POA: Insufficient documentation

## 2019-07-01 DIAGNOSIS — I10 Essential (primary) hypertension: Secondary | ICD-10-CM | POA: Insufficient documentation

## 2019-07-01 DIAGNOSIS — G43009 Migraine without aura, not intractable, without status migrainosus: Secondary | ICD-10-CM

## 2019-07-01 DIAGNOSIS — Y929 Unspecified place or not applicable: Secondary | ICD-10-CM | POA: Insufficient documentation

## 2019-07-01 DIAGNOSIS — G43909 Migraine, unspecified, not intractable, without status migrainosus: Secondary | ICD-10-CM | POA: Insufficient documentation

## 2019-07-01 DIAGNOSIS — Y9389 Activity, other specified: Secondary | ICD-10-CM | POA: Insufficient documentation

## 2019-07-01 DIAGNOSIS — E119 Type 2 diabetes mellitus without complications: Secondary | ICD-10-CM | POA: Insufficient documentation

## 2019-07-01 DIAGNOSIS — R42 Dizziness and giddiness: Secondary | ICD-10-CM | POA: Insufficient documentation

## 2019-07-01 DIAGNOSIS — W1839XA Other fall on same level, initial encounter: Secondary | ICD-10-CM | POA: Insufficient documentation

## 2019-07-01 DIAGNOSIS — Z7984 Long term (current) use of oral hypoglycemic drugs: Secondary | ICD-10-CM | POA: Insufficient documentation

## 2019-07-01 DIAGNOSIS — Z79899 Other long term (current) drug therapy: Secondary | ICD-10-CM | POA: Insufficient documentation

## 2019-07-01 HISTORY — DX: COVID-19: U07.1

## 2019-07-01 LAB — CBC
HCT: 33 % — ABNORMAL LOW (ref 36.0–46.0)
Hemoglobin: 10 g/dL — ABNORMAL LOW (ref 12.0–15.0)
MCH: 24.5 pg — ABNORMAL LOW (ref 26.0–34.0)
MCHC: 30.3 g/dL (ref 30.0–36.0)
MCV: 80.9 fL (ref 80.0–100.0)
Platelets: 298 K/uL (ref 150–400)
RBC: 4.08 MIL/uL (ref 3.87–5.11)
RDW: 13.1 % (ref 11.5–15.5)
WBC: 10.5 K/uL (ref 4.0–10.5)
nRBC: 0 % (ref 0.0–0.2)

## 2019-07-01 NOTE — ED Triage Notes (Signed)
Pt arrived via POV from home with reports of migraine headache that started 1 hr and fall. Pt states she was getting ready to come here to be evaluated for her migraine HA when she got dizzy and fell.  Pt reports migraine feels like typical migraine HA, c/o feeling nauseated and having photosensitivity.  Pt does not have rescue meds at home, states it's been 2 years since last migraine.

## 2019-07-02 LAB — BASIC METABOLIC PANEL
Anion gap: 8 (ref 5–15)
BUN: 15 mg/dL (ref 6–20)
CO2: 28 mmol/L (ref 22–32)
Calcium: 8.8 mg/dL — ABNORMAL LOW (ref 8.9–10.3)
Chloride: 102 mmol/L (ref 98–111)
Creatinine, Ser: 0.75 mg/dL (ref 0.44–1.00)
GFR calc Af Amer: >60 mL/min (ref >60)
GFR calc non Af Amer: >60 mL/min (ref >60)
Glucose, Bld: 241 mg/dL — ABNORMAL HIGH (ref 70–99)
Potassium: 3.8 mmol/L (ref 3.5–5.1)
Sodium: 138 mmol/L (ref 135–145)

## 2019-07-02 LAB — TROPONIN I (HIGH SENSITIVITY): Troponin I (High Sensitivity): 3 ng/L (ref <18)

## 2019-07-02 MED ORDER — KETOROLAC TROMETHAMINE 30 MG/ML IJ SOLN
10.0000 mg | Freq: Once | INTRAMUSCULAR | Status: AC
  Administered 2019-07-02: 9.9 mg via INTRAVENOUS
  Filled 2019-07-02: qty 1

## 2019-07-02 MED ORDER — PROCHLORPERAZINE EDISYLATE 10 MG/2ML IJ SOLN
2.5000 mg | Freq: Once | INTRAMUSCULAR | Status: AC
  Administered 2019-07-02: 2.5 mg via INTRAVENOUS
  Filled 2019-07-02: qty 2

## 2019-07-02 MED ORDER — PROCHLORPERAZINE MALEATE 10 MG PO TABS: 10.0000 mg | ORAL_TABLET | Freq: Four times a day (QID) | ORAL | 0 refills | Status: DC | PRN

## 2019-07-02 MED ORDER — DIPHENHYDRAMINE HCL 50 MG/ML IJ SOLN
25.0000 mg | Freq: Once | INTRAMUSCULAR | Status: AC
  Administered 2019-07-02: 25 mg via INTRAVENOUS
  Filled 2019-07-02: qty 1

## 2019-07-02 NOTE — ED Provider Notes (Signed)
[facility] Emergency Department Provider Note   ____________________________________________   First MD Initiated Contact with Patient 07/02/19 0003     (approximate)  I have reviewed the triage vital signs and the nursing notes.   HISTORY  Chief Complaint Migraine and Fall    HPI Helen Webster is a 44 y.o. female who presents to the ED from home with a chief complaint of migraine headache and fall.  Patient has a history of migraine headaches and complains of nodule onset global headache which began approximately 10 PM.  She was in the process of getting ready to come to the ED when she stood up, became dizzy and fell.  Denies injury from the fall.  Describes headache as typical migraine headache associated with nausea and photophobia.  No meds at home for headache.  Denies recent fever, cough, chest pain, shortness of breath, abdominal pain, vomiting.       Past Medical History:  Diagnosis Date  . COVID-19   . Diabetes mellitus without complication (HCC)   . Hypertension   . Migraine     There are no problems to display for this patient.   Past Surgical History:  Procedure Laterality Date  . CESAREAN SECTION      Prior to Admission medications   Medication Sig Start Date End Date Taking? Authorizing Provider  amLODipine (NORVASC) 5 MG tablet Take 1 tablet (5 mg total) by mouth daily. 02/24/17 02/24/18  Loleta Rose, MD  amoxicillin (AMOXIL) 875 MG tablet Take 1 tablet (875 mg total) by mouth 2 (two) times daily. 04/11/18   Fisher, Roselyn Bering, PA-C  benzonatate (TESSALON) 200 MG capsule Take 1 capsule (200 mg total) by mouth 3 (three) times daily as needed for cough. 04/11/18   Fisher, Roselyn Bering, PA-C  chlorpheniramine-HYDROcodone (TUSSIONEX PENNKINETIC ER) 10-8 MG/5ML SUER Take 5 mLs by mouth 2 (two) times daily. 04/15/19   Joni Reining, PA-C  cyclobenzaprine (FEXMID) 7.5 MG tablet Take 1 tablet (7.5 mg total) by mouth 3 (three) times daily as needed  for muscle spasms. 09/09/18   Sharyn Creamer, MD  ibuprofen (ADVIL,MOTRIN) 800 MG tablet Take 1 tablet (800 mg total) by mouth every 8 (eight) hours as needed for moderate pain. 08/12/18   Irean Hong, MD  lidocaine (LIDODERM) 5 % Place 1 patch onto the skin daily. Remove & Discard patch within 12 hours or as directed by MD 07/16/18   Enid Derry, PA-C  metFORMIN (GLUCOPHAGE) 500 MG tablet Take 1 tablet (500 mg total) by mouth 2 (two) times daily with a meal. Patient taking differently: Take 500 mg daily with breakfast by mouth.  02/09/17 02/09/18  Rebecka Apley, MD  methylPREDNISolone (MEDROL DOSEPAK) 4 MG TBPK tablet Take Tapered dose as directed 04/15/19   Joni Reining, PA-C  naproxen (NAPROSYN) 500 MG tablet Take 1 tablet (500 mg total) by mouth 2 (two) times daily with a meal. 08/11/17   Triplett, Cari B, FNP  ondansetron (ZOFRAN ODT) 4 MG disintegrating tablet Take 1 tablet (4 mg total) by mouth every 8 (eight) hours as needed for nausea or vomiting. 08/12/18   Irean Hong, MD  prochlorperazine (COMPAZINE) 10 MG tablet Take 1 tablet (10 mg total) by mouth every 6 (six) hours as needed for nausea (headache). 07/02/19   Irean Hong, MD  tobramycin (TOBREX) 0.3 % ophthalmic solution Place 2 drops into both eyes every 4 (four) hours. 04/11/18   Faythe Ghee, PA-C    Allergies  Patient has no known allergies.  No family history on file.  Social History Social History   Tobacco Use  . Smoking status: Never Smoker  . Smokeless tobacco: Never Used  Substance Use Topics  . Alcohol use: No  . Drug use: No    Review of Systems  Constitutional: No fever/chills Eyes: No visual changes. ENT: No sore throat. Cardiovascular: Denies chest pain. Respiratory: Denies shortness of breath. Gastrointestinal: No abdominal pain.  No nausea, no vomiting.  No diarrhea.  No constipation. Genitourinary: Negative for dysuria. Musculoskeletal: Negative for back pain. Skin: Negative for  rash. Neurological: Positive for headache. Negative for focal weakness or numbness.   ____________________________________________   PHYSICAL EXAM:  VITAL SIGNS: ED Triage Vitals  Enc Vitals Group     BP 07/01/19 2306 (!) 178/110     Pulse Rate 07/01/19 2306 83     Resp 07/01/19 2306 20     Temp 07/01/19 2306 98.7 F (37.1 C)     Temp Source 07/01/19 2306 Oral     SpO2 07/01/19 2306 100 %     Weight 07/01/19 2302 175 lb (79.4 kg)     Height 07/01/19 2302 5\' 5"  (1.651 m)     Head Circumference --      Peak Flow --      Pain Score 07/01/19 2302 10     Pain Loc --      Pain Edu? --      Excl. in GC? --     Constitutional: Alert and oriented. Well appearing and in mild acute distress. Eyes: Conjunctivae are normal. PERRL. EOMI. Head: Atraumatic. Nose: No congestion/rhinnorhea. Mouth/Throat: Mucous membranes are moist.  Oropharynx non-erythematous. Neck: No stridor.  Supple neck without meningismus. Cardiovascular: Normal rate, regular rhythm. Grossly normal heart sounds.  Good peripheral circulation. Respiratory: Normal respiratory effort.  No retractions. Lungs CTAB. Gastrointestinal: Soft and nontender. No distention. No abdominal bruits. No CVA tenderness. Musculoskeletal: No lower extremity tenderness nor edema.  No joint effusions. Neurologic: Alert and oriented x3.  CN II-XII grossly intact.  Normal speech and language. No gross focal neurologic deficits are appreciated. No gait instability. Skin:  Skin is warm, dry and intact. No rash noted.  No petechiae. Psychiatric: Mood and affect are normal. Speech and behavior are normal.  ____________________________________________   LABS (all labs ordered are listed, but only abnormal results are displayed)  Labs Reviewed  BASIC METABOLIC PANEL - Abnormal; Notable for the following components:      Result Value   Glucose, Bld 241 (*)    Calcium 8.8 (*)    All other components within normal limits  CBC - Abnormal;  Notable for the following components:   Hemoglobin 10.0 (*)    HCT 33.0 (*)    MCH 24.5 (*)    All other components within normal limits  URINALYSIS, COMPLETE (UACMP) WITH MICROSCOPIC  POC URINE PREG, ED  TROPONIN I (HIGH SENSITIVITY)   ____________________________________________  EKG  ED ECG REPORT I, Coda Filler J, the attending physician, personally viewed and interpreted this ECG.   Date: 07/02/2019  EKG Time: 2314  Rate: 80  Rhythm: normal EKG, normal sinus rhythm  Axis: Normal  Intervals:none  ST&T Change: Nonspecific  ____________________________________________  RADIOLOGY  ED MD interpretation: None  Official radiology report(s): No results found.  ____________________________________________   PROCEDURES  Procedure(s) performed (including Critical Care):  Procedures   ____________________________________________   INITIAL IMPRESSION / ASSESSMENT AND PLAN / ED COURSE  As part of my medical decision  making, I reviewed the following data within the [place] notes reviewed and incorporated, Labs reviewed, EKG interpreted, Old chart reviewed and Notes from prior ED visits     Helen Webster was evaluated in Emergency Department on 07/02/2019 for the symptoms described in the history of present illness. She was evaluated in the context of the global COVID-19 pandemic, which necessitated consideration that the patient might be at risk for infection with the SARS-CoV-2 virus that causes COVID-19. Institutional protocols and algorithms that pertain to the evaluation of patients at risk for COVID-19 are in a state of rapid change based on information released by regulatory bodies including the CDC and federal and state organizations. These policies and algorithms were followed during the patient's care in the ED.    44 year old female with history of migraines who presents with migraine and fall from dizziness. Differential diagnosis includes,  but is not limited to, intracranial hemorrhage, meningitis/encephalitis, previous head trauma, cavernous venous thrombosis, tension headache, temporal arteritis, migraine or migraine equivalent, idiopathic intracranial hypertension, and non-specific headache.  Patient reports typical migraine headache.  Neck is supple without meningismus.  No focal neurological deficits on exam.  Will administer IV medicines to include Toradol, Compazine and Benadryl.  Will reassess.   Clinical Course as of Jul 01 305  Wed Jul 02, 2019  0126 Patient feeling better.  Neck remains supple without meningismus.  No focal neurological deficits on reexamination.  Will discharge home with prescription for Compazine to use as needed.  Strict return precautions given.  Patient verbalizes understanding agrees with plan of care.   [JS]    Clinical Course User Index [JS] Paulette Blanch, MD     ____________________________________________   FINAL CLINICAL IMPRESSION(S) / ED DIAGNOSES  Final diagnoses:  Migraine without aura and without status migrainosus, not intractable     ED Discharge Orders         Ordered    prochlorperazine (COMPAZINE) 10 MG tablet  Every 6 hours PRN     07/02/19 0127           Note:  This document was prepared using Dragon voice recognition software and may include unintentional dictation errors.   Paulette Blanch, MD 07/02/19 [phone]

## 2019-07-02 NOTE — Discharge Instructions (Signed)
1.  You may take Compazine as needed for headache/nausea. 2.  Return to the ER for worsening symptoms, persistent vomiting, lethargy or other concerns.

## 2019-07-12 ENCOUNTER — Other Ambulatory Visit: Payer: Self-pay

## 2019-07-12 DIAGNOSIS — T7840XA Allergy, unspecified, initial encounter: Secondary | ICD-10-CM | POA: Diagnosis not present

## 2019-07-12 DIAGNOSIS — E119 Type 2 diabetes mellitus without complications: Secondary | ICD-10-CM | POA: Diagnosis not present

## 2019-07-12 DIAGNOSIS — Z7984 Long term (current) use of oral hypoglycemic drugs: Secondary | ICD-10-CM | POA: Insufficient documentation

## 2019-07-12 DIAGNOSIS — Z79899 Other long term (current) drug therapy: Secondary | ICD-10-CM | POA: Insufficient documentation

## 2019-07-12 DIAGNOSIS — I10 Essential (primary) hypertension: Secondary | ICD-10-CM | POA: Insufficient documentation

## 2019-07-12 MED ORDER — DIPHENHYDRAMINE HCL 50 MG/ML IJ SOLN
50.0000 mg | Freq: Once | INTRAMUSCULAR | Status: AC
  Administered 2019-07-12: 50 mg via INTRAVENOUS
  Filled 2019-07-12: qty 1

## 2019-07-12 MED ORDER — METHYLPREDNISOLONE SODIUM SUCC 125 MG IJ SOLR
125.0000 mg | Freq: Once | INTRAMUSCULAR | Status: AC
  Administered 2019-07-12: 125 mg via INTRAVENOUS
  Filled 2019-07-12: qty 2

## 2019-07-12 MED ORDER — SODIUM CHLORIDE 0.9 % IV BOLUS
500.0000 mL | Freq: Once | INTRAVENOUS | Status: AC
  Administered 2019-07-12: 500 mL via INTRAVENOUS

## 2019-07-12 MED ORDER — FAMOTIDINE IN NACL 20-0.9 MG/50ML-% IV SOLN
20.0000 mg | Freq: Once | INTRAVENOUS | Status: AC
  Administered 2019-07-12: 20 mg via INTRAVENOUS
  Filled 2019-07-12: qty 50

## 2019-07-12 NOTE — ED Triage Notes (Signed)
?  allergic reaction to unknown allergen.  Patient reports symptoms for approximately 1 hours.  Reports face itches, eyes feel swollen and throat is itching.  Patients face is noted to be red, patient is able to speak in complete sentences without difficulty or respiratory distress at triage.

## 2019-07-13 ENCOUNTER — Emergency Department
Admission: EM | Admit: 2019-07-13 | Discharge: 2019-07-13 | Disposition: A | Discharge status: Home / Self Care | Payer: BC Managed Care – PPO | Attending: Emergency Medicine | Admitting: Emergency Medicine

## 2019-07-13 DIAGNOSIS — T7840XA Allergy, unspecified, initial encounter: Secondary | ICD-10-CM

## 2019-07-13 DIAGNOSIS — I10 Essential (primary) hypertension: Secondary | ICD-10-CM

## 2019-07-13 NOTE — ED Provider Notes (Signed)
[facility] Emergency Department Provider Note   ____________________________________________   First MD Initiated Contact with Patient 07/13/19 [phone]     (approximate)  I have reviewed the triage vital signs and the nursing notes.   HISTORY  Chief Complaint Allergic Reaction    HPI Helen Webster is a 44 y.o. female with past medical history of hypertension and diabetes presents to the ED complaining of allergic reaction.  Patient reports that about an hour prior to arrival she noted acute onset of redness, swelling, and itching to much of her face and cheeks.  She denies any difficulty breathing, lightheadedness, vomiting, or diarrhea.  She denies any history of similar reactions in the past.  She is not aware of any changes to her medications, soaps, or detergents.  She has never had any similar reactions in the past, denies any history of anaphylaxis.        Past Medical History:  Diagnosis Date  . COVID-19   . Diabetes mellitus without complication (HCC)   . Hypertension   . Migraine     There are no problems to display for this patient.   Past Surgical History:  Procedure Laterality Date  . CESAREAN SECTION      Prior to Admission medications   Medication Sig Start Date End Date Taking? Authorizing Provider  amLODipine (NORVASC) 5 MG tablet Take 1 tablet (5 mg total) by mouth daily. 02/24/17 02/24/18  Loleta Rose, MD  amoxicillin (AMOXIL) 875 MG tablet Take 1 tablet (875 mg total) by mouth 2 (two) times daily. 04/11/18   Fisher, Roselyn Bering, PA-C  benzonatate (TESSALON) 200 MG capsule Take 1 capsule (200 mg total) by mouth 3 (three) times daily as needed for cough. 04/11/18   Fisher, Roselyn Bering, PA-C  chlorpheniramine-HYDROcodone (TUSSIONEX PENNKINETIC ER) 10-8 MG/5ML SUER Take 5 mLs by mouth 2 (two) times daily. 04/15/19   Joni Reining, PA-C  cyclobenzaprine (FEXMID) 7.5 MG tablet Take 1 tablet (7.5 mg total) by mouth 3 (three) times daily as needed  for muscle spasms. 09/09/18   Sharyn Creamer, MD  ibuprofen (ADVIL,MOTRIN) 800 MG tablet Take 1 tablet (800 mg total) by mouth every 8 (eight) hours as needed for moderate pain. 08/12/18   Irean Hong, MD  lidocaine (LIDODERM) 5 % Place 1 patch onto the skin daily. Remove & Discard patch within 12 hours or as directed by MD 07/16/18   Enid Derry, PA-C  metFORMIN (GLUCOPHAGE) 500 MG tablet Take 1 tablet (500 mg total) by mouth 2 (two) times daily with a meal. Patient taking differently: Take 500 mg daily with breakfast by mouth.  02/09/17 02/09/18  Rebecka Apley, MD  methylPREDNISolone (MEDROL DOSEPAK) 4 MG TBPK tablet Take Tapered dose as directed 04/15/19   Joni Reining, PA-C  naproxen (NAPROSYN) 500 MG tablet Take 1 tablet (500 mg total) by mouth 2 (two) times daily with a meal. 08/11/17   Triplett, Cari B, FNP  ondansetron (ZOFRAN ODT) 4 MG disintegrating tablet Take 1 tablet (4 mg total) by mouth every 8 (eight) hours as needed for nausea or vomiting. 08/12/18   Irean Hong, MD  prochlorperazine (COMPAZINE) 10 MG tablet Take 1 tablet (10 mg total) by mouth every 6 (six) hours as needed for nausea (headache). 07/02/19   Irean Hong, MD  tobramycin (TOBREX) 0.3 % ophthalmic solution Place 2 drops into both eyes every 4 (four) hours. 04/11/18   Faythe Ghee, PA-C    Allergies Patient has no  known allergies.  No family history on file.  Social History Social History   Tobacco Use  . Smoking status: Never Smoker  . Smokeless tobacco: Never Used  Substance Use Topics  . Alcohol use: No  . Drug use: No    Review of Systems  Constitutional: No fever/chills Eyes: No visual changes. ENT: No sore throat. Cardiovascular: Denies chest pain. Respiratory: Denies shortness of breath. Gastrointestinal: No abdominal pain.  No nausea, no vomiting.  No diarrhea.  No constipation. Genitourinary: Negative for dysuria. Musculoskeletal: Negative for back pain. Skin: Positive for  rash. Neurological: Negative for headaches, focal weakness or numbness.  ____________________________________________   PHYSICAL EXAM:  VITAL SIGNS: ED Triage Vitals  Enc Vitals Group     BP 07/12/19 2120 (!) 187/108     Pulse Rate 07/12/19 2120 100     Resp 07/12/19 2120 18     Temp 07/12/19 2120 98.4 F (36.9 C)     Temp Source 07/12/19 2120 Oral     SpO2 07/12/19 2120 98 %     Weight 07/12/19 2121 180 lb (81.6 kg)     Height 07/12/19 2121 5\' 5"  (1.651 m)     Head Circumference --      Peak Flow --      Pain Score 07/12/19 2120 0     Pain Loc --      Pain Edu? --      Excl. in [place]? --     Constitutional: Alert and oriented. Eyes: Conjunctivae are normal. Head: Atraumatic.  Mild diffuse erythema and edema to face, no significant tongue or lip edema. Nose: No congestion/rhinnorhea. Mouth/Throat: Mucous membranes are moist. Neck: Normal ROM Cardiovascular: Normal rate, regular rhythm. Grossly normal heart sounds. Respiratory: Normal respiratory effort.  No retractions. Lungs CTAB. Gastrointestinal: Soft and nontender. No distention. Genitourinary: deferred Musculoskeletal: No lower extremity tenderness nor edema. Neurologic:  Normal speech and language. No gross focal neurologic deficits are appreciated. Skin:  Skin is warm, dry and intact. No rash noted. Psychiatric: Mood and affect are normal. Speech and behavior are normal.  ____________________________________________   LABS (all labs ordered are listed, but only abnormal results are displayed)  Labs Reviewed - No data to display   PROCEDURES  Procedure(s) performed (including Critical Care):  Procedures   ____________________________________________   INITIAL IMPRESSION / ASSESSMENT AND PLAN / ED COURSE       44 year old female presents to the ED for redness, itching, and swelling noted to face approximately 1 hour prior to arrival.  Symptoms do not appear consistent with anaphylaxis or angioedema,  she received steroids, Benadryl, H2 blocker, and fluid bolus from triage.  Her rash and edema seem to be improving, no indication for epinephrine at this time.  She is appropriate for discharge home and I have counseled her to continue taking Benadryl as needed.  Counseled patient to return to the ED for new or worsening symptoms, patient agrees with plan.  She is noted to have significantly elevated blood pressure here, does have a history of hypertension is not currently taking any medications for this.  There is no apparent hypertensive emergency and patient states she has follow-up scheduled with a new PCP to address this.      ____________________________________________   FINAL CLINICAL IMPRESSION(S) / ED DIAGNOSES  Final diagnoses:  Allergic reaction, initial encounter  Hypertension, unspecified type     ED Discharge Orders    None       Note:  This document was prepared using  Dragon [employer] engineer and may include unintentional dictation errors.   Chesley Noon, MD 07/13/19 [phone]

## 2019-08-27 ENCOUNTER — Other Ambulatory Visit: Payer: Self-pay

## 2019-08-27 ENCOUNTER — Emergency Department: Payer: Worker's Compensation

## 2019-08-27 ENCOUNTER — Encounter: Payer: Self-pay | Admitting: Emergency Medicine

## 2019-08-27 ENCOUNTER — Emergency Department
Admission: EM | Admit: 2019-08-27 | Discharge: 2019-08-27 | Disposition: A | Discharge status: Home / Self Care | Payer: Worker's Compensation | Attending: Emergency Medicine | Admitting: Emergency Medicine

## 2019-08-27 DIAGNOSIS — Y990 Civilian activity done for income or pay: Secondary | ICD-10-CM | POA: Insufficient documentation

## 2019-08-27 DIAGNOSIS — Y929 Unspecified place or not applicable: Secondary | ICD-10-CM | POA: Insufficient documentation

## 2019-08-27 DIAGNOSIS — I10 Essential (primary) hypertension: Secondary | ICD-10-CM | POA: Insufficient documentation

## 2019-08-27 DIAGNOSIS — Y9389 Activity, other specified: Secondary | ICD-10-CM | POA: Insufficient documentation

## 2019-08-27 DIAGNOSIS — Z79899 Other long term (current) drug therapy: Secondary | ICD-10-CM | POA: Insufficient documentation

## 2019-08-27 DIAGNOSIS — S3992XA Unspecified injury of lower back, initial encounter: Secondary | ICD-10-CM | POA: Diagnosis present

## 2019-08-27 DIAGNOSIS — Z8616 Personal history of COVID-19: Secondary | ICD-10-CM | POA: Insufficient documentation

## 2019-08-27 DIAGNOSIS — S39012A Strain of muscle, fascia and tendon of lower back, initial encounter: Secondary | ICD-10-CM | POA: Diagnosis not present

## 2019-08-27 DIAGNOSIS — X509XXA Other and unspecified overexertion or strenuous movements or postures, initial encounter: Secondary | ICD-10-CM | POA: Insufficient documentation

## 2019-08-27 DIAGNOSIS — E119 Type 2 diabetes mellitus without complications: Secondary | ICD-10-CM | POA: Diagnosis not present

## 2019-08-27 LAB — POCT PREGNANCY, URINE: Preg Test, Ur: NEGATIVE

## 2019-08-27 MED ORDER — METHOCARBAMOL 500 MG PO TABS: 500.0000 mg | ORAL_TABLET | Freq: Four times a day (QID) | ORAL | 0 refills | Status: DC

## 2019-08-27 MED ORDER — KETOROLAC TROMETHAMINE 30 MG/ML IJ SOLN
30.0000 mg | Freq: Once | INTRAMUSCULAR | Status: AC
  Administered 2019-08-27: 30 mg via INTRAMUSCULAR
  Filled 2019-08-27: qty 1

## 2019-08-27 MED ORDER — OXYCODONE-ACETAMINOPHEN 5-325 MG PO TABS
1.0000 | ORAL_TABLET | Freq: Once | ORAL | Status: AC
  Administered 2019-08-27: 1 via ORAL
  Filled 2019-08-27: qty 1

## 2019-08-27 MED ORDER — ORPHENADRINE CITRATE 30 MG/ML IJ SOLN
60.0000 mg | Freq: Once | INTRAMUSCULAR | Status: AC
  Administered 2019-08-27: 60 mg via INTRAMUSCULAR
  Filled 2019-08-27: qty 2

## 2019-08-27 MED ORDER — MELOXICAM 15 MG PO TABS: 15.0000 mg | ORAL_TABLET | Freq: Every day | ORAL | 0 refills | Status: DC

## 2019-08-27 NOTE — ED Triage Notes (Signed)
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  States twisted wrong to help assist her and has felt cramping to left lower back and shooting pain down left leg.  Denies back pain hx.  Filing WC, works at [employer] at [place].

## 2019-08-27 NOTE — ED Notes (Signed)
Workmans comp UDS completed.

## 2019-08-27 NOTE — ED Provider Notes (Signed)
[facility] Emergency Department Provider Note  ____________________________________________  Time seen: Approximately 9:35 PM  I have reviewed the triage vital signs and the nursing notes.   HISTORY  Chief Complaint Back Pain    HPI Helen Webster is a 44 y.o. female who presents the emergency department complaining of left-sided back and left hip pain after an injury at work.  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  The patient reached out to grab the resident,  hurting her back in the process.  Patient states that it is a sharp pain radiating from the left side of the back into the left hip and into the left groin.  No loss of sensation lower extremity.  No bowel or bladder dysfunction, saddle anesthesia, paresthesias.  Patient states that she will have intermittent sharp cramping to the left paraspinal muscle region.  No direct trauma.  Patient did not fall.  No other complaints.  No GI or urinary complaints.        Past Medical History:  Diagnosis Date  . COVID-19   . Diabetes mellitus without complication (HCC)   . Hypertension   . Migraine     There are no problems to display for this patient.   Past Surgical History:  Procedure Laterality Date  . CESAREAN SECTION      Prior to Admission medications   Medication Sig Start Date End Date Taking? Authorizing Provider  amLODipine (NORVASC) 5 MG tablet Take 1 tablet (5 mg total) by mouth daily. 02/24/17 02/24/18  Loleta Rose, MD  amoxicillin (AMOXIL) 875 MG tablet Take 1 tablet (875 mg total) by mouth 2 (two) times daily. 04/11/18   Fisher, Roselyn Bering, PA-C  benzonatate (TESSALON) 200 MG capsule Take 1 capsule (200 mg total) by mouth 3 (three) times daily as needed for cough. 04/11/18   Fisher, Roselyn Bering, PA-C  chlorpheniramine-HYDROcodone (TUSSIONEX PENNKINETIC ER)  10-8 MG/5ML SUER Take 5 mLs by mouth 2 (two) times daily. 04/15/19   Joni Reining, PA-C  cyclobenzaprine (FEXMID) 7.5 MG tablet Take 1 tablet (7.5 mg total) by mouth 3 (three) times daily as needed for muscle spasms. 09/09/18   Sharyn Creamer, MD  ibuprofen (ADVIL,MOTRIN) 800 MG tablet Take 1 tablet (800 mg total) by mouth every 8 (eight) hours as needed for moderate pain. 08/12/18   Irean Hong, MD  lidocaine (LIDODERM) 5 % Place 1 patch onto the skin daily. Remove & Discard patch within 12 hours or as directed by MD 07/16/18   Enid Derry, PA-C  meloxicam (MOBIC) 15 MG tablet Take 1 tablet (15 mg total) by mouth daily. 08/27/19   Ashaad Gaertner, Delorise Royals, PA-C  metFORMIN (GLUCOPHAGE) 500 MG tablet Take 1 tablet (500 mg total) by mouth 2 (two) times daily with a meal. Patient taking differently: Take 500 mg daily with breakfast by mouth.  02/09/17 02/09/18  Rebecka Apley, MD  methocarbamol (ROBAXIN) 500 MG tablet Take 1 tablet (500 mg total) by mouth 4 (four) times daily. 08/27/19   Averlee Swartz, Delorise Royals, PA-C  methylPREDNISolone (MEDROL DOSEPAK) 4 MG TBPK tablet Take Tapered dose as directed 04/15/19   Joni Reining, PA-C  naproxen (NAPROSYN) 500 MG tablet Take 1 tablet (500 mg total) by mouth 2 (two) times daily with a meal. 08/11/17   Triplett, Cari B, FNP  ondansetron (ZOFRAN ODT) 4  MG disintegrating tablet Take 1 tablet (4 mg total) by mouth every 8 (eight) hours as needed for nausea or vomiting. 08/12/18   Irean Hong, MD  prochlorperazine (COMPAZINE) 10 MG tablet Take 1 tablet (10 mg total) by mouth every 6 (six) hours as needed for nausea (headache). 07/02/19   Irean Hong, MD  tobramycin (TOBREX) 0.3 % ophthalmic solution Place 2 drops into both eyes every 4 (four) hours. 04/11/18   Faythe Ghee, PA-C    Allergies Patient has no known allergies.  History reviewed. No pertinent family history.  Social History Social History   Tobacco Use  . Smoking status: Never Smoker  . Smokeless  tobacco: Never Used  Substance Use Topics  . Alcohol use: No  . Drug use: No     Review of Systems  Constitutional: No fever/chills Eyes: No visual changes. No discharge ENT: No upper respiratory complaints. Cardiovascular: no chest pain. Respiratory: no cough. No SOB. Gastrointestinal: No abdominal pain.  No nausea, no vomiting.  No diarrhea.  No constipation. Genitourinary: Negative for dysuria. No hematuria Musculoskeletal: Left-sided lower back pain after an injury at work Skin: Negative for rash, abrasions, lacerations, ecchymosis. Neurological: Negative for headaches, focal weakness or numbness. 10-point ROS otherwise negative.  ____________________________________________   PHYSICAL EXAM:  VITAL SIGNS: ED Triage Vitals  Enc Vitals Group     BP 08/27/19 2058 (!) 190/117     Pulse Rate 08/27/19 2058 89     Resp 08/27/19 2058 16     Temp 08/27/19 2058 99.3 F (37.4 C)     Temp Source 08/27/19 2058 Oral     SpO2 08/27/19 2058 98 %     Weight 08/27/19 2059 180 lb (81.6 kg)     Height 08/27/19 2059 5\' 5"  (1.651 m)     Head Circumference --      Peak Flow --      Pain Score 08/27/19 2059 10     Pain Loc --      Pain Edu? --      Excl. in [place]? --      Constitutional: Alert and oriented. Well appearing and in no acute distress. Eyes: Conjunctivae are normal. PERRL. EOMI. Head: Atraumatic. ENT:      Ears:       Nose: No congestion/rhinnorhea.      Mouth/Throat: Mucous membranes are moist.  Neck: No stridor.    Cardiovascular: Normal rate, regular rhythm. Normal S1 and S2.  Good peripheral circulation. Respiratory: Normal respiratory effort without tachypnea or retractions. Lungs CTAB. Good air entry to the bases with no decreased or absent breath sounds. Gastrointestinal: Bowel sounds 4 quadrants. Soft and nontender to palpation. No guarding or rigidity. No palpable masses. No distention. No CVA tenderness. Musculoskeletal: Full range of motion to all  extremities. No gross deformities appreciated.  Visualization of the lumbar spine reveals no visible signs of trauma.  Patient is very tender to palpation diffusely midline and left paraspinal muscle groups.  This tenderness extends into the left SI joint, left sciatic notch, as well as mildly through the proximal femur into the left groin.  No palpable abnormalities or step-off.  Dorsalis pedis pulse intact distally.  Sensation intact distally. Neurologic:  Normal speech and language. No gross focal neurologic deficits are appreciated.  Skin:  Skin is warm, dry and intact. No rash noted. Psychiatric: Mood and affect are normal. Speech and behavior are normal. Patient exhibits appropriate insight and judgement.   ____________________________________________   LABS (all  labs ordered are listed, but only abnormal results are displayed)  Labs Reviewed  POC URINE PREG, ED  POCT PREGNANCY, URINE   ____________________________________________  EKG   ____________________________________________  RADIOLOGY I personally viewed and evaluated these images as part of my medical decision making, as well as reviewing the written report by the radiologist.  I concur with radiologist finding of no acute osseous abnormality.  DG Lumbar Spine 2-3 Views  Result Date: 08/27/2019 CLINICAL DATA:  44 year old female with back injury and pain. EXAM: LUMBAR SPINE - 2-3 VIEW COMPARISON:  CT of the abdomen pelvis dated 08/11/2018. FINDINGS: There is 5 lumbar type vertebra. There is no acute fracture or subluxation of the lumbar spine. There is slight straightening of normal lumbar lordosis. The visualized posterior elements are intact. The soft tissues are unremarkable. IMPRESSION: No acute/ traumatic lumbar spine pathology. Electronically Signed   By: Elgie Collard M.D.   On: 08/27/2019 22:35   DG Hip Unilat W or Wo Pelvis 2-3 Views Left  Result Date: 08/27/2019 CLINICAL DATA:  Back pain EXAM: DG HIP (WITH  OR WITHOUT PELVIS) 2-3V LEFT COMPARISON:  None. FINDINGS: There is no evidence of hip fracture or dislocation. There is no evidence of arthropathy or other focal bone abnormality. Calcification adjacent to the greater trochanter of the left femur. IMPRESSION: Negative. Electronically Signed   By: Deatra Robinson M.D.   On: 08/27/2019 22:42    ____________________________________________    PROCEDURES  Procedure(s) performed:    Procedures    Medications  orphenadrine (NORFLEX) injection 60 mg (has no administration in time range)  oxyCODONE-acetaminophen (PERCOCET/ROXICET) 5-325 MG per tablet 1 tablet (1 tablet Oral Given 08/27/19 2157)  ketorolac (TORADOL) 30 MG/ML injection 30 mg (30 mg Intramuscular Given 08/27/19 2304)     ____________________________________________   INITIAL IMPRESSION / ASSESSMENT AND PLAN / ED COURSE  Pertinent labs & imaging results that were available during my care of the patient were reviewed by me and considered in my medical decision making (see chart for details).  Review of the [place] CSRS was performed in accordance of the [facility] prior to dispensing any controlled drugs.           Patient's diagnosis is consistent with lumbar strain.  Patient presented to the emergency department complaining of lower back pain extending into her left hip.  This injury occurred at work.  Exam was most consistent with lumbar strain but given nature of injury and her pain complaints imaging was obtained.  No acute osseous abnormality.  Patient is given Percocet, Toradol, Norflex here in the emergency department for symptom improvement.. Patient will be discharged home with prescriptions for meloxicam and Robaxin. Patient is to follow up with orthopedics as needed or otherwise directed. Patient is given ED precautions to return to the ED for any worsening or new symptoms.     ____________________________________________  FINAL CLINICAL IMPRESSION(S) / ED  DIAGNOSES  Final diagnoses:  Strain of lumbar region, initial encounter      NEW MEDICATIONS STARTED DURING THIS VISIT:  ED Discharge Orders         Ordered    meloxicam (MOBIC) 15 MG tablet  Daily     08/27/19 2303    methocarbamol (ROBAXIN) 500 MG tablet  4 times daily     08/27/19 2303              This chart was dictated using voice recognition software/Dragon. Despite best efforts to proofread, errors can occur which can change the meaning.  Any change was purely unintentional.    Brynda Peon 08/27/19 2307    Carrie Mew, MD 08/29/19 0004

## 2019-08-27 NOTE — ED Notes (Addendum)
Pt is requesting this visit to be WC. Employer profile is not on record, but this RN spoke with a Ms. Earney Hamburg (pt's HR director). She reports that a UDS is required, as well as a RTW note detailing any restrictions.  Pt is employed at [employer] at [facility] [employer] in [place]. Phone number for HR director is [phone]

## 2019-11-16 ENCOUNTER — Encounter: Payer: Self-pay | Admitting: Emergency Medicine

## 2019-11-16 ENCOUNTER — Emergency Department
Admission: EM | Admit: 2019-11-16 | Discharge: 2019-11-16 | Disposition: A | Discharge status: Home / Self Care | Payer: Self-pay | Attending: Emergency Medicine | Admitting: Emergency Medicine

## 2019-11-16 ENCOUNTER — Emergency Department: Payer: Self-pay

## 2019-11-16 ENCOUNTER — Other Ambulatory Visit: Payer: Self-pay

## 2019-11-16 DIAGNOSIS — Z7984 Long term (current) use of oral hypoglycemic drugs: Secondary | ICD-10-CM | POA: Insufficient documentation

## 2019-11-16 DIAGNOSIS — M25562 Pain in left knee: Secondary | ICD-10-CM | POA: Insufficient documentation

## 2019-11-16 DIAGNOSIS — Z79899 Other long term (current) drug therapy: Secondary | ICD-10-CM | POA: Insufficient documentation

## 2019-11-16 DIAGNOSIS — I10 Essential (primary) hypertension: Secondary | ICD-10-CM | POA: Insufficient documentation

## 2019-11-16 DIAGNOSIS — E119 Type 2 diabetes mellitus without complications: Secondary | ICD-10-CM | POA: Insufficient documentation

## 2019-11-16 MED ORDER — KETOROLAC TROMETHAMINE 30 MG/ML IJ SOLN
30.0000 mg | Freq: Once | INTRAMUSCULAR | Status: AC
  Administered 2019-11-16: 30 mg via INTRAMUSCULAR
  Filled 2019-11-16: qty 1

## 2019-11-16 MED ORDER — IBUPROFEN 600 MG PO TABS
600.0000 mg | ORAL_TABLET | Freq: Four times a day (QID) | ORAL | 0 refills | Status: DC | PRN
Start: 2019-11-16 — End: 2020-02-12

## 2019-11-16 MED ORDER — TRAMADOL HCL 50 MG PO TABS: 50.0000 mg | ORAL_TABLET | Freq: Four times a day (QID) | ORAL | 0 refills | Status: DC | PRN

## 2019-11-16 MED ORDER — OXYCODONE-ACETAMINOPHEN 5-325 MG PO TABS
1.0000 | ORAL_TABLET | Freq: Once | ORAL | Status: AC
  Administered 2019-11-16: 1 via ORAL
  Filled 2019-11-16: qty 1

## 2019-11-16 NOTE — ED Triage Notes (Signed)
Pt to ED via POV c/o left knee pain x 1 week. Pt states that she was at work and felt a pop in her knee. Pt is in NAD.

## 2019-11-16 NOTE — ED Provider Notes (Signed)
[facility] Emergency Department Provider Note  ____________________________________________  Time seen: Approximately 8:19 AM  I have reviewed the triage vital signs and the nursing notes.   HISTORY  Chief Complaint Knee Pain    HPI Helen Webster is a 44 y.o. female that presents to the emergency department for evaluation of left knee pain after injury 1 week ago. 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 She has had pain to the inside of her knee since this incident. No previous knee pain or trauma. She has been taking Motrin and Tylenol for pain. Patient does have a history of high blood pressure and checks her blood pressure regularly. No headache, shortness of breath.   Past Medical History:  Diagnosis Date  . COVID-19   . Diabetes mellitus without complication ([place])   . Hypertension   . Migraine     There are no problems to display for this patient.   Past Surgical History:  Procedure Laterality Date  . CESAREAN SECTION      Prior to Admission medications   Medication Sig Start Date End Date Taking? Authorizing Provider  amLODipine (NORVASC) 5 MG tablet Take 1 tablet (5 mg total) by mouth daily. 02/24/17 02/24/18  Hinda Kehr, MD  amoxicillin (AMOXIL) 875 MG tablet Take 1 tablet (875 mg total) by mouth 2 (two) times daily. 04/11/18   Fisher, Linden Dolin, PA-C  benzonatate (TESSALON) 200 MG capsule Take 1 capsule (200 mg total) by mouth 3 (three) times daily as needed for cough. 04/11/18   Fisher, Linden Dolin, PA-C  chlorpheniramine-HYDROcodone (TUSSIONEX PENNKINETIC ER) 10-8 MG/5ML SUER Take 5 mLs by mouth 2 (two) times daily. 04/15/19   Sable Feil, PA-C  cyclobenzaprine (FEXMID) 7.5 MG tablet Take 1 tablet (7.5 mg total) by mouth 3 (three) times daily as needed for muscle spasms. 09/09/18   Delman Kitten, MD  ibuprofen (ADVIL) 600 MG tablet Take 1 tablet (600 mg total) by mouth every 6 (six) hours as needed. 11/16/19   Laban Emperor, PA-C   lidocaine (LIDODERM) 5 % Place 1 patch onto the skin daily. Remove & Discard patch within 12 hours or as directed by MD 07/16/18   Laban Emperor, PA-C  meloxicam (MOBIC) 15 MG tablet Take 1 tablet (15 mg total) by mouth daily. 08/27/19   Cuthriell, Charline Bills, PA-C  metFORMIN (GLUCOPHAGE) 500 MG tablet Take 1 tablet (500 mg total) by mouth 2 (two) times daily with a meal. Patient taking differently: Take 500 mg daily with breakfast by mouth.  02/09/17 02/09/18  Loney Hering, MD  methocarbamol (ROBAXIN) 500 MG tablet Take 1 tablet (500 mg total) by mouth 4 (four) times daily. 08/27/19   Cuthriell, Charline Bills, PA-C  methylPREDNISolone (MEDROL DOSEPAK) 4 MG TBPK tablet Take Tapered dose as directed 04/15/19   Sable Feil, PA-C  naproxen (NAPROSYN) 500 MG tablet Take 1 tablet (500 mg total) by mouth 2 (two) times daily with a meal. 08/11/17   Triplett, Cari B, FNP  ondansetron (ZOFRAN ODT) 4 MG disintegrating tablet Take 1 tablet (4 mg total) by mouth every 8 (eight) hours as needed for nausea or vomiting. 08/12/18   Paulette Blanch, MD  prochlorperazine (COMPAZINE) 10 MG tablet Take 1 tablet (10 mg total) by mouth every 6 (six) hours as needed for nausea (headache). 07/02/19   Paulette Blanch, MD  tobramycin (TOBREX) 0.3 % ophthalmic solution Place 2 drops into both eyes every 4 (four) hours. 04/11/18  Faythe Ghee, PA-C  traMADol (ULTRAM) 50 MG tablet Take 1 tablet (50 mg total) by mouth every 6 (six) hours as needed. 11/16/19 11/15/20  Enid Derry, PA-C    Allergies Patient has no known allergies.  No family history on file.  Social History Social History   Tobacco Use  . Smoking status: Never Smoker  . Smokeless tobacco: Never Used  Substance Use Topics  . Alcohol use: No  . Drug use: No     Review of Systems  Respiratory: No SOB. Gastrointestinal: No nausea, no vomiting.  Musculoskeletal: Positive for knee pain. Skin: Negative for rash, abrasions, lacerations,  ecchymosis. Neurological: Negative for umbness or tingling   ____________________________________________   PHYSICAL EXAM:  VITAL SIGNS: ED Triage Vitals  Enc Vitals Group     BP 11/16/19 0750 (!) 183/110     Pulse Rate 11/16/19 0749 94     Resp 11/16/19 0749 16     Temp 11/16/19 0749 99 F (37.2 C)     Temp Source 11/16/19 0749 Oral     SpO2 11/16/19 0749 100 %     Weight 11/16/19 0748 175 lb (79.4 kg)     Height 11/16/19 0748 5\' 4"  (1.626 m)     Head Circumference --      Peak Flow --      Pain Score 11/16/19 0748 8     Pain Loc --      Pain Edu? --      Excl. in GC? --      Constitutional: Alert and oriented. Well appearing and in no acute distress. Eyes: Conjunctivae are normal. PERRL. EOMI. Head: Atraumatic. ENT:      Ears:      Nose: No congestion/rhinnorhea.      Mouth/Throat: Mucous membranes are moist.  Neck: No stridor. Cardiovascular: Normal rate, regular rhythm.  Good peripheral circulation. Respiratory: Normal respiratory effort without tachypnea or retractions. Lungs CTAB. Good air entry to the bases with no decreased or absent breath sounds. Musculoskeletal: Full range of motion to all extremities. No gross deformities appreciated. Mild tenderness to palpation to medial left knee. No swelling. Full range of motion of left knee with mild pain. Weightbearing. Neurologic:  Normal speech and language. No gross focal neurologic deficits are appreciated.  Skin:  Skin is warm, dry and intact. No rash noted. Psychiatric: Mood and affect are normal. Speech and behavior are normal. Patient exhibits appropriate insight and judgement.   ____________________________________________   LABS (all labs ordered are listed, but only abnormal results are displayed)  Labs Reviewed - No data to display ____________________________________________  EKG   ____________________________________________  RADIOLOGY 01/16/20, personally viewed and evaluated these  images (plain radiographs) as part of my medical decision making, as well as reviewing the written report by the radiologist.  DG Knee Complete 4 Views Left  Result Date: 11/16/2019 CLINICAL DATA:  Pt states left knee pain x 1 week. Pt states that she was at work and felt a pop in her knee. No hx of injury or surgery to left knee. EXAM: LEFT KNEE - COMPLETE 4+ VIEW COMPARISON:  None. FINDINGS: No evidence of fracture, dislocation, or joint effusion. No evidence of arthropathy or other focal bone abnormality. Soft tissues are unremarkable. IMPRESSION: Negative. Electronically Signed   By: 01/16/2020 M.D.   On: 11/16/2019 08:54    ____________________________________________    PROCEDURES  Procedure(s) performed:    Procedures    Medications  ketorolac (TORADOL) 30 MG/ML injection 30  mg (30 mg Intramuscular Given 11/16/19 0947)  oxyCODONE-acetaminophen (PERCOCET/ROXICET) 5-325 MG per tablet 1 tablet (1 tablet Oral Given 11/16/19 0948)     ____________________________________________   INITIAL IMPRESSION / ASSESSMENT AND PLAN / ED COURSE  Pertinent labs & imaging results that were available during my care of the patient were reviewed by me and considered in my medical decision making (see chart for details).  Review of the [place] CSRS was performed in accordance of the [facility] prior to dispensing any controlled drugs.     Patient patient presented the emergency department for evaluation of knee pain. Vital signs and exam are reassuring. X-ray negative for acute bony abnormalities. Knee brace was placed. Crutches were given. Patient's blood pressure was elevated in the emergency department. Patient feels that the second is secondary to pain. She monitors her blood pressure regularly at home and it is usually within normal range. She denies any headache, shortness of breath, chest pain. She will follow up with primary care for recheck. Patient will be discharged home with prescriptions for  Motrin and a short course of tramadol. Patient is to follow up with orthopedics as directed. Referral to Dr. Josephine Igo was given. Patient is given ED precautions to return to the ED for any worsening or new symptoms.   Helen Webster was evaluated in Emergency Department on 11/16/2019 for the symptoms described in the history of present illness. She was evaluated in the context of the global COVID-19 pandemic, which necessitated consideration that the patient might be at risk for infection with the SARS-CoV-2 virus that causes COVID-19. Institutional protocols and algorithms that pertain to the evaluation of patients at risk for COVID-19 are in a state of rapid change based on information released by regulatory bodies including the CDC and federal and state organizations. These policies and algorithms were followed during the patient's care in the ED.  ____________________________________________  FINAL CLINICAL IMPRESSION(S) / ED DIAGNOSES  Final diagnoses:  Acute pain of left knee      NEW MEDICATIONS STARTED DURING THIS VISIT:  ED Discharge Orders         Ordered    traMADol (ULTRAM) 50 MG tablet  Every 6 hours PRN     11/16/19 0926    ibuprofen (ADVIL) 600 MG tablet  Every 6 hours PRN     11/16/19 0926              This chart was dictated using voice recognition software/Dragon. Despite best efforts to proofread, errors can occur which can change the meaning. Any change was purely unintentional.    Enid Derry, PA-C 11/16/19 1237    Dionne Bucy, MD 11/16/19 [phone]

## 2019-12-16 ENCOUNTER — Telehealth: Payer: Self-pay

## 2019-12-16 NOTE — Telephone Encounter (Signed)
Copied from CRM [id number]. Topic: General - Other >> Dec 16, 2019  3:53 PM Worthy Keeler D wrote: Reason for CRM: Patient would like to become a new patient but not seeing any available appts for M. Flinchum

## 2020-01-29 ENCOUNTER — Emergency Department
Admission: EM | Admit: 2020-01-29 | Discharge: 2020-01-29 | Disposition: A | Discharge status: Home / Self Care | Payer: PRIVATE HEALTH INSURANCE | Attending: Emergency Medicine | Admitting: Emergency Medicine

## 2020-01-29 ENCOUNTER — Encounter: Payer: Self-pay | Admitting: Emergency Medicine

## 2020-01-29 ENCOUNTER — Other Ambulatory Visit: Payer: Self-pay

## 2020-01-29 DIAGNOSIS — Z5321 Procedure and treatment not carried out due to patient leaving prior to being seen by health care provider: Secondary | ICD-10-CM | POA: Insufficient documentation

## 2020-01-29 DIAGNOSIS — R1110 Vomiting, unspecified: Secondary | ICD-10-CM | POA: Insufficient documentation

## 2020-01-29 DIAGNOSIS — R197 Diarrhea, unspecified: Secondary | ICD-10-CM | POA: Insufficient documentation

## 2020-01-29 LAB — COMPREHENSIVE METABOLIC PANEL
ALT: 11 U/L (ref 0–44)
AST: 14 U/L — ABNORMAL LOW (ref 15–41)
Albumin: 4.1 g/dL (ref 3.5–5.0)
Alkaline Phosphatase: 78 U/L (ref 38–126)
Anion gap: 11 (ref 5–15)
BUN: 9 mg/dL (ref 6–20)
CO2: 25 mmol/L (ref 22–32)
Calcium: 9.5 mg/dL (ref 8.9–10.3)
Chloride: 98 mmol/L (ref 98–111)
Creatinine, Ser: 0.58 mg/dL (ref 0.44–1.00)
GFR calc Af Amer: >60 mL/min (ref >60)
GFR calc non Af Amer: >60 mL/min (ref >60)
Glucose, Bld: 284 mg/dL — ABNORMAL HIGH (ref 70–99)
Potassium: 3.9 mmol/L (ref 3.5–5.1)
Sodium: 134 mmol/L — ABNORMAL LOW (ref 135–145)
Total Bilirubin: 0.6 mg/dL (ref 0.3–1.2)
Total Protein: 7.8 g/dL (ref 6.5–8.1)

## 2020-01-29 LAB — URINALYSIS, COMPLETE (UACMP) WITH MICROSCOPIC
Bacteria, UA: NONE SEEN
Bilirubin Urine: NEGATIVE
Glucose, UA: >=500 mg/dL — AB
Ketones, ur: NEGATIVE mg/dL
Leukocytes,Ua: NEGATIVE
Nitrite: NEGATIVE
Protein, ur: 30 mg/dL — AB
Specific Gravity, Urine: 1.02 (ref 1.005–1.030)
pH: 5 (ref 5.0–8.0)

## 2020-01-29 LAB — CBC
HCT: 29.2 % — ABNORMAL LOW (ref 36.0–46.0)
Hemoglobin: 9.1 g/dL — ABNORMAL LOW (ref 12.0–15.0)
MCH: 22.9 pg — ABNORMAL LOW (ref 26.0–34.0)
MCHC: 31.2 g/dL (ref 30.0–36.0)
MCV: 73.6 fL — ABNORMAL LOW (ref 80.0–100.0)
Platelets: 354 10*3/uL (ref 150–400)
RBC: 3.97 MIL/uL (ref 3.87–5.11)
RDW: 13.9 % (ref 11.5–15.5)
WBC: 10.6 10*3/uL — ABNORMAL HIGH (ref 4.0–10.5)
nRBC: 0 % (ref 0.0–0.2)

## 2020-01-29 LAB — LIPASE, BLOOD: Lipase: 35 U/L (ref 11–51)

## 2020-01-29 LAB — POCT PREGNANCY, URINE: Preg Test, Ur: NEGATIVE

## 2020-01-29 NOTE — ED Triage Notes (Signed)
Presents with a 2 day hx of diarrhea  Vomiting since yesterday  Last time for vomiting and diarrhea was about 6 am

## 2020-01-29 NOTE — ED Notes (Signed)
Called No answer.

## 2020-02-10 ENCOUNTER — Other Ambulatory Visit: Payer: Self-pay

## 2020-02-10 ENCOUNTER — Emergency Department: Payer: BC Managed Care – PPO

## 2020-02-10 ENCOUNTER — Observation Stay
Admission: EM | Admit: 2020-02-10 | Discharge: 2020-02-12 | Disposition: A | Discharge status: Home / Self Care | Payer: BC Managed Care – PPO | Attending: Internal Medicine | Admitting: Internal Medicine

## 2020-02-10 DIAGNOSIS — E119 Type 2 diabetes mellitus without complications: Secondary | ICD-10-CM | POA: Diagnosis not present

## 2020-02-10 DIAGNOSIS — E1165 Type 2 diabetes mellitus with hyperglycemia: Secondary | ICD-10-CM | POA: Insufficient documentation

## 2020-02-10 DIAGNOSIS — I471 Supraventricular tachycardia: Principal | ICD-10-CM

## 2020-02-10 DIAGNOSIS — Z20822 Contact with and (suspected) exposure to covid-19: Secondary | ICD-10-CM | POA: Insufficient documentation

## 2020-02-10 DIAGNOSIS — Z7984 Long term (current) use of oral hypoglycemic drugs: Secondary | ICD-10-CM | POA: Diagnosis not present

## 2020-02-10 DIAGNOSIS — R55 Syncope and collapse: Secondary | ICD-10-CM

## 2020-02-10 DIAGNOSIS — G43909 Migraine, unspecified, not intractable, without status migrainosus: Secondary | ICD-10-CM | POA: Diagnosis not present

## 2020-02-10 DIAGNOSIS — E669 Obesity, unspecified: Secondary | ICD-10-CM | POA: Diagnosis present

## 2020-02-10 DIAGNOSIS — E785 Hyperlipidemia, unspecified: Secondary | ICD-10-CM | POA: Diagnosis present

## 2020-02-10 DIAGNOSIS — Z79899 Other long term (current) drug therapy: Secondary | ICD-10-CM | POA: Insufficient documentation

## 2020-02-10 DIAGNOSIS — I10 Essential (primary) hypertension: Secondary | ICD-10-CM

## 2020-02-10 DIAGNOSIS — Z7901 Long term (current) use of anticoagulants: Secondary | ICD-10-CM | POA: Insufficient documentation

## 2020-02-10 DIAGNOSIS — R079 Chest pain, unspecified: Secondary | ICD-10-CM | POA: Diagnosis present

## 2020-02-10 LAB — CBC WITH DIFFERENTIAL/PLATELET
Abs Immature Granulocytes: 0.06 10*3/uL (ref 0.00–0.07)
Basophils Absolute: 0 10*3/uL (ref 0.0–0.1)
Basophils Relative: 0 %
Eosinophils Absolute: 0 10*3/uL (ref 0.0–0.5)
Eosinophils Relative: 0 %
HCT: 32.5 % — ABNORMAL LOW (ref 36.0–46.0)
Hemoglobin: 9.9 g/dL — ABNORMAL LOW (ref 12.0–15.0)
Immature Granulocytes: 0 %
Lymphocytes Relative: 11 %
Lymphs Abs: 1.5 10*3/uL (ref 0.7–4.0)
MCH: 22.6 pg — ABNORMAL LOW (ref 26.0–34.0)
MCHC: 30.5 g/dL (ref 30.0–36.0)
MCV: 74.2 fL — ABNORMAL LOW (ref 80.0–100.0)
Monocytes Absolute: 0.7 10*3/uL (ref 0.1–1.0)
Monocytes Relative: 5 %
Neutro Abs: 11.3 10*3/uL — ABNORMAL HIGH (ref 1.7–7.7)
Neutrophils Relative %: 84 %
Platelets: 273 10*3/uL (ref 150–400)
RBC: 4.38 MIL/uL (ref 3.87–5.11)
RDW: 14.1 % (ref 11.5–15.5)
WBC: 13.7 10*3/uL — ABNORMAL HIGH (ref 4.0–10.5)
nRBC: 0 % (ref 0.0–0.2)

## 2020-02-10 LAB — COMPREHENSIVE METABOLIC PANEL
ALT: 16 U/L (ref 0–44)
AST: 23 U/L (ref 15–41)
Albumin: 4.1 g/dL (ref 3.5–5.0)
Alkaline Phosphatase: 90 U/L (ref 38–126)
Anion gap: 12 (ref 5–15)
BUN: 10 mg/dL (ref 6–20)
CO2: 24 mmol/L (ref 22–32)
Calcium: 8.9 mg/dL (ref 8.9–10.3)
Chloride: 98 mmol/L (ref 98–111)
Creatinine, Ser: 0.52 mg/dL (ref 0.44–1.00)
GFR calc Af Amer: >60 mL/min (ref >60)
GFR calc non Af Amer: >60 mL/min (ref >60)
Glucose, Bld: 355 mg/dL — ABNORMAL HIGH (ref 70–99)
Potassium: 3.6 mmol/L (ref 3.5–5.1)
Sodium: 134 mmol/L — ABNORMAL LOW (ref 135–145)
Total Bilirubin: 0.6 mg/dL (ref 0.3–1.2)
Total Protein: 8.2 g/dL — ABNORMAL HIGH (ref 6.5–8.1)

## 2020-02-10 LAB — BRAIN NATRIURETIC PEPTIDE: B Natriuretic Peptide: 71.8 pg/mL (ref 0.0–100.0)

## 2020-02-10 LAB — GLUCOSE, CAPILLARY: Glucose-Capillary: 283 mg/dL — ABNORMAL HIGH (ref 70–99)

## 2020-02-10 LAB — SARS CORONAVIRUS 2 BY RT PCR (HOSPITAL ORDER, PERFORMED IN ~~LOC~~ HOSPITAL LAB): SARS Coronavirus 2: NEGATIVE

## 2020-02-10 LAB — HCG, QUANTITATIVE, PREGNANCY: hCG, Beta Chain, Quant, S: <1 m[IU]/mL (ref <5)

## 2020-02-10 LAB — MAGNESIUM: Magnesium: 1.6 mg/dL — ABNORMAL LOW (ref 1.7–2.4)

## 2020-02-10 LAB — TROPONIN I (HIGH SENSITIVITY)
Troponin I (High Sensitivity): 27 ng/L — ABNORMAL HIGH (ref <18)
Troponin I (High Sensitivity): 71 ng/L — ABNORMAL HIGH (ref <18)
Troponin I (High Sensitivity): 60 ng/L — ABNORMAL HIGH (ref <18)

## 2020-02-10 LAB — TSH: TSH: 1.167 u[IU]/mL (ref 0.350–4.500)

## 2020-02-10 MED ORDER — ACETAMINOPHEN 500 MG PO TABS: 1000.0000 mg | ORAL_TABLET | Freq: Once | ORAL | Status: AC

## 2020-02-10 MED ORDER — ACETAMINOPHEN 325 MG PO TABS
650.0000 mg | ORAL_TABLET | ORAL | Status: DC | PRN
  Administered 2020-02-11: 650 mg via ORAL
  Filled 2020-02-10: qty 2

## 2020-02-10 MED ORDER — IOHEXOL 350 MG/ML SOLN
75.0000 mL | Freq: Once | INTRAVENOUS | Status: AC | PRN
  Administered 2020-02-10: 75 mL via INTRAVENOUS

## 2020-02-10 MED ORDER — ACETAMINOPHEN 500 MG PO TABS
ORAL_TABLET | ORAL | Status: AC
  Administered 2020-02-10: 1000 mg via ORAL
  Filled 2020-02-10: qty 2

## 2020-02-10 MED ORDER — GUAIFENESIN 100 MG/5ML PO SOLN
15.0000 mL | ORAL | Status: DC | PRN
  Administered 2020-02-11: 300 mg via ORAL
  Filled 2020-02-10 (×3): qty 15

## 2020-02-10 MED ORDER — ONDANSETRON HCL 4 MG/2ML IJ SOLN: 4.0000 mg | Freq: Four times a day (QID) | INTRAMUSCULAR | Status: DC | PRN

## 2020-02-10 MED ORDER — MAGNESIUM SULFATE IN D5W 1-5 GM/100ML-% IV SOLN
1.0000 g | Freq: Once | INTRAVENOUS | Status: AC
  Administered 2020-02-10: 1 g via INTRAVENOUS
  Filled 2020-02-10: qty 100

## 2020-02-10 MED ORDER — ENOXAPARIN SODIUM 40 MG/0.4ML ~~LOC~~ SOLN
40.0000 mg | SUBCUTANEOUS | Status: DC
  Administered 2020-02-10 – 2020-02-11 (×2): 40 mg via SUBCUTANEOUS
  Filled 2020-02-10 (×2): qty 0.4

## 2020-02-10 MED ORDER — INSULIN ASPART 100 UNIT/ML ~~LOC~~ SOLN
0.0000 [IU] | Freq: Every day | SUBCUTANEOUS | Status: DC
  Administered 2020-02-10: 3 [IU] via SUBCUTANEOUS
  Filled 2020-02-10: qty 1

## 2020-02-10 MED ORDER — INSULIN ASPART 100 UNIT/ML ~~LOC~~ SOLN
0.0000 [IU] | Freq: Three times a day (TID) | SUBCUTANEOUS | Status: DC
  Administered 2020-02-11 (×2): 5 [IU] via SUBCUTANEOUS
  Administered 2020-02-11: 3 [IU] via SUBCUTANEOUS
  Administered 2020-02-12: 5 [IU] via SUBCUTANEOUS
  Administered 2020-02-12: 3 [IU] via SUBCUTANEOUS
  Filled 2020-02-10 (×5): qty 1

## 2020-02-10 MED ORDER — LISINOPRIL 10 MG PO TABS
10.0000 mg | ORAL_TABLET | Freq: Every day | ORAL | Status: DC
  Administered 2020-02-10: 10 mg via ORAL
  Filled 2020-02-10 (×2): qty 1

## 2020-02-10 NOTE — ED Notes (Signed)
Helped pt to the toilet. Brought PT a blanket.  Pt is back in bed with no other needs at this time. Has call bell.

## 2020-02-10 NOTE — ED Notes (Signed)
Pt ambulatory to toilet

## 2020-02-10 NOTE — ED Triage Notes (Signed)
Pt arrives to ER via [facility] from home where pt was walking to restroom and woke up on floor. When EMS arrived pt was in SVT. 6mg  adenosine and 12mg  adonsine given. Pt converted after 12mg . That dose was at 1355. Pt arrives stating CP. Had negative covid test Sunday. C/o nasal congestion after traveling to . A&O upon arrival. On scene was unresponsive. EMS reports highest HR was 210. CBG 276. EMS BP 192/95.

## 2020-02-10 NOTE — H&P (Signed)
[facility] History and Physical  Fleda Pagel LFY:[id number] DOB: 11-Sep-1975 DOA: 02/10/2020  Referring physician: Dr. Marisa Severin PCP: [place]   Chief Complaint: syncope  HPI: Helen Webster is a 44 y.o. female with history of type 2 diabetes, hypertension, who presents after episode of chest pain and syncope.  On my history patient reports that she has felt unwell for the past week with URI symptoms of cough and congestion for which she has had several negative COVID tests. She also reports that around 2 months ago she was lying in bed and had an episode of palpitations that lasted for several minutes and then went away spontaneously. She reports that she works as a Engineer, civil (consulting) and has not had chest pain at any other time. Denies orthopnea or leg swelling.  Early this morning she was cleaning up around the house when she suddenly developed palpitations and chest pain that lasted around 10 minutes and resolved with rest. Later in the day she was walking to the bathroom when she developed palpitations and chest pain again and completely lost consciousness. She was with her husband on the phone at the time who was at work and he called EMS, she states she was awakened by EMS using smelling salts. She was then transported to the ED.   Per EMS she was initially in SVT and received adenosine x2 and subsequently converted to sinus rhythm. In the ED provider reviewed initial EKG obtained by EMTs and confirmed the patient had been in SVT, though no longer so upon arrival to the ED.  Initial vital signs notable for mild tachycardia in the low 100s that subsequently improved, intermittent tachypnea into the low 20s, and significant hypertension with blood pressures ranging 140s to 170s over 90s to 120s.  Lab work-up notable for CMP showing significant hyperglycemia with glucose 355 as well as mild hypomagnesemia at 1.6, CBC showed mild leukocytosis of 13.7 and stable anemia with hemoglobin 9.9, TSH  was normal, pregnancy test was negative, Covid test was negative.  Initial troponin was 27 and up trended to 71.  EKG was obtained which showed some demand related changes in the inferior leads but no evidence of ACS.  She had a CTPA which was negative for PE or any other acute intrathoracic pathology.  ED provider discussed patient with cardiology, plan to hobs patient overnight and will be seen in the morning.  Patient was admitted for further management of her SVT and syncope.  Review of Systems:  Pertinent positives and negative per HPI, all others reviewed and negative  Past Medical History:  Diagnosis Date  . COVID-19   . Diabetes mellitus without complication (HCC)   . Hypertension   . Migraine    Past Surgical History:  Procedure Laterality Date  . CESAREAN SECTION     Social History:  reports that she has never smoked. She has never used smokeless tobacco. She reports that she does not drink alcohol and does not use drugs.  No Known Allergies  History reviewed. No pertinent family history.   Prior to Admission medications   Medication Sig Start Date End Date Taking? Authorizing Provider  amLODipine (NORVASC) 5 MG tablet Take 1 tablet (5 mg total) by mouth daily. 02/24/17 02/24/18  Loleta Rose, MD  amoxicillin (AMOXIL) 875 MG tablet Take 1 tablet (875 mg total) by mouth 2 (two) times daily. 04/11/18   Fisher, Roselyn Bering, PA-C  benzonatate (TESSALON) 200 MG capsule Take 1 capsule (200 mg total) by mouth 3 (  three) times daily as needed for cough. 04/11/18   Fisher, Roselyn Bering, PA-C  chlorpheniramine-HYDROcodone (TUSSIONEX PENNKINETIC ER) 10-8 MG/5ML SUER Take 5 mLs by mouth 2 (two) times daily. 04/15/19   Joni Reining, PA-C  cyclobenzaprine (FEXMID) 7.5 MG tablet Take 1 tablet (7.5 mg total) by mouth 3 (three) times daily as needed for muscle spasms. 09/09/18   Sharyn Creamer, MD  ibuprofen (ADVIL) 600 MG tablet Take 1 tablet (600 mg total) by mouth every 6 (six) hours as needed. 11/16/19    Enid Derry, PA-C  lidocaine (LIDODERM) 5 % Place 1 patch onto the skin daily. Remove & Discard patch within 12 hours or as directed by MD 07/16/18   Enid Derry, PA-C  meloxicam (MOBIC) 15 MG tablet Take 1 tablet (15 mg total) by mouth daily. 08/27/19   Cuthriell, Delorise Royals, PA-C  metFORMIN (GLUCOPHAGE) 500 MG tablet Take 1 tablet (500 mg total) by mouth 2 (two) times daily with a meal. Patient taking differently: Take 500 mg daily with breakfast by mouth.  02/09/17 02/09/18  Rebecka Apley, MD  methocarbamol (ROBAXIN) 500 MG tablet Take 1 tablet (500 mg total) by mouth 4 (four) times daily. 08/27/19   Cuthriell, Delorise Royals, PA-C  methylPREDNISolone (MEDROL DOSEPAK) 4 MG TBPK tablet Take Tapered dose as directed 04/15/19   Joni Reining, PA-C  naproxen (NAPROSYN) 500 MG tablet Take 1 tablet (500 mg total) by mouth 2 (two) times daily with a meal. 08/11/17   Triplett, Cari B, FNP  ondansetron (ZOFRAN ODT) 4 MG disintegrating tablet Take 1 tablet (4 mg total) by mouth every 8 (eight) hours as needed for nausea or vomiting. 08/12/18   Irean Hong, MD  prochlorperazine (COMPAZINE) 10 MG tablet Take 1 tablet (10 mg total) by mouth every 6 (six) hours as needed for nausea (headache). 07/02/19   Irean Hong, MD  tobramycin (TOBREX) 0.3 % ophthalmic solution Place 2 drops into both eyes every 4 (four) hours. 04/11/18   Sherrie Mustache Roselyn Bering, PA-C  traMADol (ULTRAM) 50 MG tablet Take 1 tablet (50 mg total) by mouth every 6 (six) hours as needed. 11/16/19 11/15/20  Enid Derry, PA-C   Physical Exam: Vitals:   02/10/20 1830 02/10/20 1930 02/10/20 2000 02/10/20 2031  BP:  (!) 165/103 (!) 173/112 (!) 174/111  Pulse:  88 85   Resp: (!) 22 (!) 22 20   Temp:      TempSrc:      SpO2:  100% 100%   Weight:      Height:        Wt Readings from Last 3 Encounters:  02/10/20 79.4 kg  11/16/19 79.4 kg  08/27/19 81.6 kg     . General:  Appears calm and comfortable . Eyes: PERRL, normal lids, irises &  conjunctiva . ENT: grossly normal hearing, lips & tongue . Neck: no masses . Cardiovascular: RRR, no m/r/g. No LE edema. . Telemetry: SR, no arrhythmias  . Respiratory: CTA bilaterally, no w/r/r. Normal respiratory effort. . Abdomen: soft, ntnd . Skin: no rash or induration seen on limited exam . Musculoskeletal: grossly normal tone BUE/BLE . Psychiatric: grossly normal mood and affect, speech fluent and appropriate . Neurologic: grossly non-focal.          Labs on Admission:  Basic Metabolic Panel: Recent Labs  Lab 02/10/20 1452  NA 134*  K 3.6  CL 98  CO2 24  GLUCOSE 355*  BUN 10  CREATININE 0.52  CALCIUM 8.9  MG 1.6*  Liver Function Tests: Recent Labs  Lab 02/10/20 1452  AST 23  ALT 16  ALKPHOS 90  BILITOT 0.6  PROT 8.2*  ALBUMIN 4.1   No results for input(s): LIPASE, AMYLASE in the last 168 hours. No results for input(s): AMMONIA in the last 168 hours. CBC: Recent Labs  Lab 02/10/20 1452  WBC 13.7*  NEUTROABS 11.3*  HGB 9.9*  HCT 32.5*  MCV 74.2*  PLT 273   Cardiac Enzymes: No results for input(s): CKTOTAL, CKMB, CKMBINDEX, TROPONINI in the last 168 hours.  BNP (last 3 results) Recent Labs    02/10/20 1452  BNP 71.8    ProBNP (last 3 results) No results for input(s): PROBNP in the last 8760 hours.  CBG: No results for input(s): GLUCAP in the last 168 hours.  Radiological Exams on Admission: CT Angio Chest PE W and/or Wo Contrast  Result Date: 02/10/2020 CLINICAL DATA:  Syncope, supraventricular tachycardia EXAM: CT ANGIOGRAPHY CHEST WITH CONTRAST TECHNIQUE: Multidetector CT imaging of the chest was performed using the standard protocol during bolus administration of intravenous contrast. Multiplanar CT image reconstructions and MIPs were obtained to evaluate the vascular anatomy. CONTRAST:  75mL OMNIPAQUE IOHEXOL 350 MG/ML SOLN COMPARISON:  None. FINDINGS: Cardiovascular: Satisfactory opacification of the pulmonary arteries to the segmental  level. No evidence of pulmonary embolism. Normal heart size. No pericardial effusion. Mediastinum/Nodes: No enlarged mediastinal, hilar, or axillary lymph nodes. Thyroid gland, trachea, and esophagus demonstrate no significant findings. Lungs/Pleura: Lungs are clear. No pleural effusion or pneumothorax. Upper Abdomen: No acute abnormality. Musculoskeletal: No chest wall abnormality. No acute or significant osseous findings. Review of the MIP images confirms the above findings. IMPRESSION: Negative examination for pulmonary embolism. No acute intrathoracic process identified. Electronically Signed   By: Helyn NumbersAshesh  Parikh MD   On: 02/10/2020 18:03   DG Chest Portable 1 View  Result Date: 02/10/2020 CLINICAL DATA:  New onset arrhythmia, syncope, last occult walking to the bathroom, waking up on floor. EXAM: PORTABLE CHEST 1 VIEW COMPARISON:  Radiograph 04/15/2019 FINDINGS: Hazy opacities towards the lung bases with some low volumes and central vascular crowding could reflect atelectatic change. Slight prominence of the pulmonary arteries is noted though possibly accentuated by the low volumes and portable technique. No pneumothorax. No effusion. No focal consolidative process. No convincing features of edema. Remaining cardiomediastinal contours are unremarkable. No acute osseous or soft tissue abnormality. Telemetry leads overlie the chest. IMPRESSION: 1. Hazy opacities towards the lung bases with some low volumes and central vascular crowding favor atelectatic change. 2. Slight prominence of the pulmonary arteries is noted though possibly accentuated by the low volumes and portable technique. Electronically Signed   By: Kreg ShropshirePrice  DeHay M.D.   On: 02/10/2020 16:02    EKG: Independently reviewed.  Sinus tachycardia with less than 1 mm ST depressions in leads II, III, aVF, no other significant changes and otherwise normal EKG.  Compared to prior from January of this year ST changes are new.  Assessment/Plan Active  Problems:   SVT (supraventricular tachycardia) (HCC)  #Syncope #SVT #Chest pain #Elevated troponin Patient presenting after episode of what appears to be symptomatic SVT resulting in syncope. Currently well appearing and in sinus rhythm. Mildly elevated troponin likely due to demand from combination of SVT and significant HTN. Low suspicion for ACS given largely unremarkable ECG and modest troponin elevation, however patient does have significant risk factors in HTN and DM2 and may benefit from risk stratification with stress testing. In addition given significant symptoms she is likely  a good candidate for ablation to prevent future episodes of SVT. -Cardiology consultation in the a.m. -Telemetry -Trend troponin -N.p.o. after midnight for possible further cardiac testing in the a.m. -Obtain echocardiogram  #HTN Blood pressure significantly elevated on arrival.  Not currently on any medications. -start Lisinopril 10mg   #DM2 Significant hyperglycemia at 355 on initial CMP, patient denies any hx of DM2 and states she only had GDM during last pregnancy, although DM2 is noted in her chart in Care Everywhere.  -check A1c -aspart moderate ISS, defer basal insulin at this time   Code Status: Full Code, confirmed DVT Prophylaxis: Lovenox Family Communication: None Disposition Plan: Observation   Time spent: 50 min  MD/MPH [facility]

## 2020-02-10 NOTE — ED Provider Notes (Signed)
[facility] Emergency Department Provider Note ____________________________________________   First MD Initiated Contact with Patient 02/10/20 1507     (approximate)  I have reviewed the triage vital signs and the nursing notes.   HISTORY  Chief Complaint Tachycardia    HPI Helen Webster is a 44 y.o. female with PMH as noted below, and no prior cardiac history, who presents with chest pain and an episode of syncope.  The patient states that she had some nasal congestion, sinus pressure, and headaches over the last week.  She then developed a cough but no fever.  She was tested for Covid 2 days ago and got a negative result today.  Today, the patient was playing with her child and developed pressure-like chest pain.  Subsequently she started to feel lightheaded, had palpitations, and then passed out.  Since coming to, she has continued to have some pressure-like chest pain.  She states that she feels short of breath when doing minor activity such as walking across the exam room to go to the bathroom.  EMS reported that the patient was in SVT, and I was able to review the rhythm strip.  She received adenosine and converted to sinus rhythm.  Past Medical History:  Diagnosis Date  . COVID-19   . Diabetes mellitus without complication (HCC)   . Hypertension   . Migraine     Patient Active Problem List   Diagnosis Date Noted  . SVT (supraventricular tachycardia) (HCC) 02/10/2020    Past Surgical History:  Procedure Laterality Date  . CESAREAN SECTION      Prior to Admission medications   Medication Sig Start Date End Date Taking? Authorizing Provider  amLODipine (NORVASC) 5 MG tablet Take 1 tablet (5 mg total) by mouth daily. 02/24/17 02/24/18  Loleta Rose, MD  amoxicillin (AMOXIL) 875 MG tablet Take 1 tablet (875 mg total) by mouth 2 (two) times daily. 04/11/18   Fisher, Roselyn Bering, PA-C  benzonatate (TESSALON) 200 MG capsule Take 1 capsule (200 mg total) by  mouth 3 (three) times daily as needed for cough. 04/11/18   Fisher, Roselyn Bering, PA-C  chlorpheniramine-HYDROcodone (TUSSIONEX PENNKINETIC ER) 10-8 MG/5ML SUER Take 5 mLs by mouth 2 (two) times daily. 04/15/19   Joni Reining, PA-C  cyclobenzaprine (FEXMID) 7.5 MG tablet Take 1 tablet (7.5 mg total) by mouth 3 (three) times daily as needed for muscle spasms. 09/09/18   Sharyn Creamer, MD  ibuprofen (ADVIL) 600 MG tablet Take 1 tablet (600 mg total) by mouth every 6 (six) hours as needed. 11/16/19   Enid Derry, PA-C  lidocaine (LIDODERM) 5 % Place 1 patch onto the skin daily. Remove & Discard patch within 12 hours or as directed by MD 07/16/18   Enid Derry, PA-C  meloxicam (MOBIC) 15 MG tablet Take 1 tablet (15 mg total) by mouth daily. 08/27/19   Cuthriell, Delorise Royals, PA-C  metFORMIN (GLUCOPHAGE) 500 MG tablet Take 1 tablet (500 mg total) by mouth 2 (two) times daily with a meal. Patient taking differently: Take 500 mg daily with breakfast by mouth.  02/09/17 02/09/18  Rebecka Apley, MD  methocarbamol (ROBAXIN) 500 MG tablet Take 1 tablet (500 mg total) by mouth 4 (four) times daily. 08/27/19   Cuthriell, Delorise Royals, PA-C  methylPREDNISolone (MEDROL DOSEPAK) 4 MG TBPK tablet Take Tapered dose as directed 04/15/19   Joni Reining, PA-C  naproxen (NAPROSYN) 500 MG tablet Take 1 tablet (500 mg total) by mouth 2 (two) times daily with  a meal. 08/11/17   Triplett, Cari B, FNP  ondansetron (ZOFRAN ODT) 4 MG disintegrating tablet Take 1 tablet (4 mg total) by mouth every 8 (eight) hours as needed for nausea or vomiting. 08/12/18   Irean HongSung, Jade J, MD  prochlorperazine (COMPAZINE) 10 MG tablet Take 1 tablet (10 mg total) by mouth every 6 (six) hours as needed for nausea (headache). 07/02/19   Irean HongSung, Jade J, MD  tobramycin (TOBREX) 0.3 % ophthalmic solution Place 2 drops into both eyes every 4 (four) hours. 04/11/18   Sherrie MustacheFisher, Roselyn BeringSusan W, PA-C  traMADol (ULTRAM) 50 MG tablet Take 1 tablet (50 mg total) by mouth every 6  (six) hours as needed. 11/16/19 11/15/20  Enid DerryWagner, Ashley, PA-C    Allergies Patient has no known allergies.  History reviewed. No pertinent family history.  Social History Social History   Tobacco Use  . Smoking status: Never Smoker  . Smokeless tobacco: Never Used  Vaping Use  . Vaping Use: Never used  Substance Use Topics  . Alcohol use: No  . Drug use: No    Review of Systems  Constitutional: No fever/chills. Eyes: No redness. ENT: No sore throat. Cardiovascular: Positive for chest pain.  Positive for palpitations. Respiratory: Denies shortness of breath. Gastrointestinal: No vomiting or diarrhea.  Genitourinary: Negative for dysuria.  Musculoskeletal: Negative for back pain. Skin: Negative for rash. Neurological: Negative for headache.   ____________________________________________   PHYSICAL EXAM:  VITAL SIGNS: ED Triage Vitals  Enc Vitals Group     BP 02/10/20 1453 (!) 173/97     Pulse Rate 02/10/20 1450 (!) 109     Resp 02/10/20 1450 (!) 22     Temp 02/10/20 1450 99 F (37.2 C)     Temp Source 02/10/20 1450 Oral     SpO2 02/10/20 1450 99 %     Weight 02/10/20 1450 175 lb (79.4 kg)     Height 02/10/20 1450 5\' 5"  (1.651 m)     Head Circumference --      Peak Flow --      Pain Score 02/10/20 1453 8     Pain Loc --      Pain Edu? --      Excl. in GC? --     Constitutional: Alert and oriented. Well appearing and in no acute distress. Eyes: Conjunctivae are normal.  Head: Atraumatic. Nose: No congestion/rhinnorhea. Mouth/Throat: Mucous membranes are moist.   Neck: Normal range of motion.  Cardiovascular: Borderline tachycardic, regular rhythm. Grossly normal heart sounds.  Good peripheral circulation. Respiratory: Normal respiratory effort.  No retractions. Lungs CTAB. Gastrointestinal: No distention.  Musculoskeletal: No lower extremity edema.  Extremities warm and well perfused.  Neurologic:  Normal speech and language. No gross focal neurologic  deficits are appreciated.  Skin:  Skin is warm and dry. No rash noted. Psychiatric: Mood and affect are normal. Speech and behavior are normal.  ____________________________________________   LABS (all labs ordered are listed, but only abnormal results are displayed)  Labs Reviewed  CBC WITH DIFFERENTIAL/PLATELET - Abnormal; Notable for the following components:      Result Value   WBC 13.7 (*)    Hemoglobin 9.9 (*)    HCT 32.5 (*)    MCV 74.2 (*)    MCH 22.6 (*)    Neutro Abs 11.3 (*)    All other components within normal limits  COMPREHENSIVE METABOLIC PANEL - Abnormal; Notable for the following components:   Sodium 134 (*)    Glucose, Bld 355 (*)  Total Protein 8.2 (*)    All other components within normal limits  MAGNESIUM - Abnormal; Notable for the following components:   Magnesium 1.6 (*)    All other components within normal limits  TROPONIN I (HIGH SENSITIVITY) - Abnormal; Notable for the following components:   Troponin I (High Sensitivity) 27 (*)    All other components within normal limits  TROPONIN I (HIGH SENSITIVITY) - Abnormal; Notable for the following components:   Troponin I (High Sensitivity) 71 (*)    All other components within normal limits  SARS CORONAVIRUS 2 BY RT PCR (HOSPITAL ORDER, PERFORMED IN [facility])  TSH  BRAIN NATRIURETIC PEPTIDE  HCG, QUANTITATIVE, PREGNANCY  POC URINE PREG, ED   ____________________________________________  EKG  ED ECG REPORT I, Dionne Bucy, the attending physician, personally viewed and interpreted this ECG.  Date: 02/10/2020 EKG Time: 1447 Rate: 111 Rhythm: sinus tachycardia QRS Axis: normal Intervals: normal ST/T Wave abnormalities: normal Narrative Interpretation: Sinus tachycardia with no evidence of acute ischemia  ____________________________________________  RADIOLOGY  CXR: Hazy opacities at the lung bases and prominence of the pulmonary arteries CT angio chest: No PE or  other acute abnormality ____________________________________________   PROCEDURES  Procedure(s) performed: No  Procedures  Critical Care performed: No ____________________________________________   INITIAL IMPRESSION / ASSESSMENT AND PLAN / ED COURSE  Pertinent labs & imaging results that were available during my care of the patient were reviewed by me and considered in my medical decision making (see chart for details).  44 year old female with PMH as noted above but no prior cardiac history presents with SVT.  The patient initially developed some chest pain, then palpitations, and then syncopized.  She was found to be in SVT by EMS.  I reviewed the rhythm strip and confirmed this.  She was given adenosine twice and converted to sinus rhythm.  However, the patient continues to have pressure-like chest pain and dyspnea on exertion.  On exam, the patient is overall well-appearing.  Her vital signs are normal except for hypertension and a borderline elevated temperature.  O2 saturation is in the high 90s on room air.  She has no respiratory distress or increased work of breathing and her lungs are clear on exam.  She has no leg pain or swelling.  Although the syncope was likely due to SVT, the patient's chest pain and persistent symptoms after the rhythm converted heart somewhat atypical.  Differential includes PE, ACS, COVID-19, other pneumonia, bronchitis.  We will obtain a chest x-ray, lab work-up, and if no obvious pneumonia proceed with CT chest to rule out PE.  I have also ordered a COVID-19 swab.  ----------------------------------------- 7:58 PM on 02/10/2020 -----------------------------------------  Chest x-ray shows no significant infiltrates. CT chest is negative for PE.  The patient's heart rate has normalized and her vital signs remain stable.  She is not having active chest pain at this time.  Initial troponin was minimally elevated, and the repeat was slightly higher at 71.   Overall I suspect that this is demand ischemia due to her SVT episode, however given somewhat atypical nature of her presentation with chest pain and syncope, I feel that the patient would benefit from hospital observation overnight.  I consulted Dr. Lady Gary from cardiology who agreed with this plan.  He does not recommend other acute intervention.  I then discussed the case with Dr. Crissie Reese from the hospitalist service for admission.   ____________________________________________   FINAL CLINICAL IMPRESSION(S) / ED DIAGNOSES  Final diagnoses:  SVT (supraventricular tachycardia) (HCC)  Syncope, unspecified syncope type      NEW MEDICATIONS STARTED DURING THIS VISIT:  New Prescriptions   No medications on file     Note:  This document was prepared using Dragon voice recognition software and may include unintentional dictation errors.   Dionne Bucy, MD 02/10/20 2000

## 2020-02-11 ENCOUNTER — Observation Stay (HOSPITAL_BASED_OUTPATIENT_CLINIC_OR_DEPARTMENT_OTHER)
Admit: 2020-02-11 | Discharge: 2020-02-11 | Disposition: A | Discharge status: Home / Self Care | Payer: BC Managed Care – PPO | Attending: Family Medicine | Admitting: Family Medicine

## 2020-02-11 DIAGNOSIS — R55 Syncope and collapse: Secondary | ICD-10-CM | POA: Diagnosis not present

## 2020-02-11 DIAGNOSIS — E1165 Type 2 diabetes mellitus with hyperglycemia: Secondary | ICD-10-CM

## 2020-02-11 DIAGNOSIS — I471 Supraventricular tachycardia: Secondary | ICD-10-CM | POA: Diagnosis not present

## 2020-02-11 DIAGNOSIS — I10 Essential (primary) hypertension: Secondary | ICD-10-CM | POA: Diagnosis present

## 2020-02-11 DIAGNOSIS — E119 Type 2 diabetes mellitus without complications: Secondary | ICD-10-CM | POA: Diagnosis not present

## 2020-02-11 DIAGNOSIS — D72829 Elevated white blood cell count, unspecified: Secondary | ICD-10-CM

## 2020-02-11 LAB — CBC WITH DIFFERENTIAL/PLATELET
Abs Immature Granulocytes: 0.05 10*3/uL (ref 0.00–0.07)
Basophils Absolute: 0.1 10*3/uL (ref 0.0–0.1)
Basophils Relative: 0 %
Eosinophils Absolute: 0.1 10*3/uL (ref 0.0–0.5)
Eosinophils Relative: 0 %
HCT: 32.5 % — ABNORMAL LOW (ref 36.0–46.0)
Hemoglobin: 10 g/dL — ABNORMAL LOW (ref 12.0–15.0)
Immature Granulocytes: 0 %
Lymphocytes Relative: 23 %
Lymphs Abs: 3.5 10*3/uL (ref 0.7–4.0)
MCH: 23.1 pg — ABNORMAL LOW (ref 26.0–34.0)
MCHC: 30.8 g/dL (ref 30.0–36.0)
MCV: 75.1 fL — ABNORMAL LOW (ref 80.0–100.0)
Monocytes Absolute: 0.9 10*3/uL (ref 0.1–1.0)
Monocytes Relative: 6 %
Neutro Abs: 10.2 10*3/uL — ABNORMAL HIGH (ref 1.7–7.7)
Neutrophils Relative %: 71 %
Platelets: 302 10*3/uL (ref 150–400)
RBC: 4.33 MIL/uL (ref 3.87–5.11)
RDW: 14.1 % (ref 11.5–15.5)
WBC: 14.8 10*3/uL — ABNORMAL HIGH (ref 4.0–10.5)
nRBC: 0 % (ref 0.0–0.2)

## 2020-02-11 LAB — GLUCOSE, CAPILLARY
Glucose-Capillary: 248 mg/dL — ABNORMAL HIGH (ref 70–99)
Glucose-Capillary: 200 mg/dL — ABNORMAL HIGH (ref 70–99)
Glucose-Capillary: 216 mg/dL — ABNORMAL HIGH (ref 70–99)
Glucose-Capillary: 181 mg/dL — ABNORMAL HIGH (ref 70–99)

## 2020-02-11 LAB — BASIC METABOLIC PANEL
Anion gap: 11 (ref 5–15)
BUN: 13 mg/dL (ref 6–20)
CO2: 23 mmol/L (ref 22–32)
Calcium: 8.8 mg/dL — ABNORMAL LOW (ref 8.9–10.3)
Chloride: 100 mmol/L (ref 98–111)
Creatinine, Ser: 0.68 mg/dL (ref 0.44–1.00)
GFR calc Af Amer: >60 mL/min (ref >60)
GFR calc non Af Amer: >60 mL/min (ref >60)
Glucose, Bld: 265 mg/dL — ABNORMAL HIGH (ref 70–99)
Potassium: 3.9 mmol/L (ref 3.5–5.1)
Sodium: 134 mmol/L — ABNORMAL LOW (ref 135–145)

## 2020-02-11 LAB — TROPONIN I (HIGH SENSITIVITY): Troponin I (High Sensitivity): 61 ng/L — ABNORMAL HIGH (ref <18)

## 2020-02-11 LAB — HEMOGLOBIN A1C
Hgb A1c MFr Bld: 9.8 % — ABNORMAL HIGH (ref 4.8–5.6)
Mean Plasma Glucose: 234.56 mg/dL

## 2020-02-11 LAB — MAGNESIUM: Magnesium: 2.1 mg/dL (ref 1.7–2.4)

## 2020-02-11 MED ORDER — SODIUM CHLORIDE 0.9% FLUSH
3.0000 mL | Freq: Two times a day (BID) | INTRAVENOUS | Status: DC
  Administered 2020-02-11 – 2020-02-12 (×2): 3 mL via INTRAVENOUS

## 2020-02-11 MED ORDER — TRAMADOL HCL 50 MG PO TABS
50.0000 mg | ORAL_TABLET | Freq: Four times a day (QID) | ORAL | Status: DC | PRN
  Administered 2020-02-11 – 2020-02-12 (×2): 50 mg via ORAL
  Filled 2020-02-11 (×2): qty 1

## 2020-02-11 MED ORDER — LIVING WELL WITH DIABETES BOOK
Freq: Once | Status: AC
  Filled 2020-02-11: qty 1

## 2020-02-11 MED ORDER — DILTIAZEM HCL ER COATED BEADS 120 MG PO CP24
120.0000 mg | ORAL_CAPSULE | Freq: Every day | ORAL | Status: DC
  Administered 2020-02-11 – 2020-02-12 (×2): 120 mg via ORAL
  Filled 2020-02-11 (×2): qty 1

## 2020-02-11 MED ORDER — FLUTICASONE PROPIONATE 50 MCG/ACT NA SUSP
2.0000 | Freq: Every day | NASAL | Status: DC
  Administered 2020-02-11 – 2020-02-12 (×2): 2 via NASAL
  Filled 2020-02-11 (×2): qty 16

## 2020-02-11 NOTE — ED Notes (Signed)
This RN to bedside, introduced self to patient. Pt resting in bed with NAD noted at this time. CBG obtained by this RN. Pt denies further needs a this time. Call bell within reach of patient at this time. This RN explained NPO status to patient. Pt states understanding.

## 2020-02-11 NOTE — Progress Notes (Signed)
*  PRELIMINARY RESULTS* Echocardiogram 2D Echocardiogram has been performed.  Cristela Blue 02/11/2020, 11:29 AM

## 2020-02-11 NOTE — ED Notes (Signed)
Called floor again regarding inpatient bed. Per floor no inpatient bed delivered yet.

## 2020-02-11 NOTE — ED Notes (Signed)
Pt sitting up in bed eating at this time. NAD noted. Pharmacy messaged regarding living well with diabetes book.

## 2020-02-11 NOTE — ED Notes (Signed)
Medications administered as ordered. Echo complete at this time. Call bell remains within reach. Pt denies further needs. Pt remains A&O x4. Pt states understanding to use call bell should further needs arise.

## 2020-02-11 NOTE — ED Notes (Addendum)
Admitting MD at bedside at this time. Will message cardiology regarding meds and diet order. Pt states cardiology was going to give her a different medication for BP and HR control, requesting to hold Lisinopril. This RN in agreement pending confirmation with cardiology. Admitting MD aware at this time.

## 2020-02-11 NOTE — ED Notes (Signed)
Medications administered per order. Pt visualized in NAD at this time, repositioned in bed for comfort. Call bell remains within reach of patient at this time.

## 2020-02-11 NOTE — ED Notes (Signed)
This RN to bedside, pt resting in bed playing on phone, explained delay to patient, awaiting hospital bed to be placed in room per floor. Pt states understanding. Pt visualized in NAD at this time.

## 2020-02-11 NOTE — Progress Notes (Signed)
PROGRESS NOTE    Helen Webster  XNT:[id number] DOB: [birth year] DOA: 02/10/2020 PCP: [facility] Complaint  Patient presents with  . Tachycardia    Brief Narrative:  HPI per Dr.Eckstat Helen Webster is a 44 y.o. female with history of type 2 diabetes, hypertension, who presents after episode of chest pain and syncope.  On my history patient reports that she has felt unwell for the past week with URI symptoms of cough and congestion for which she has had several negative COVID tests. She also reports that around 2 months ago she was lying in bed and had an episode of palpitations that lasted for several minutes and then went away spontaneously. 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 Denies orthopnea or leg swelling.  Early this morning she was cleaning up around the house when she suddenly developed palpitations and chest pain that lasted around 10 minutes and resolved with rest. Later in the day she was walking to the bathroom when she developed palpitations and chest pain again and completely lost consciousness. She was with her husband on the phone at the time who was at work and he called EMS, she states she was awakened by EMS using smelling salts. She was then transported to the ED.   Per EMS she was initially in SVT and received adenosine x2 and subsequently converted to sinus rhythm. In the ED provider reviewed initial EKG obtained by EMTs and confirmed the patient had been in SVT, though no longer so upon arrival to the ED.  Initial vital signs notable for mild tachycardia in the low 100s that subsequently improved, intermittent tachypnea into the low 20s, and significant hypertension with blood pressures ranging 140s to 170s over 90s to 120s.  Lab work-up notable for CMP showing significant hyperglycemia with glucose 355 as well as mild hypomagnesemia at 1.6, CBC showed mild leukocytosis of 13.7 and stable anemia with  hemoglobin 9.9, TSH was normal, pregnancy test was negative, Covid test was negative.  Initial troponin was 27 and up trended to 71.  EKG was obtained which showed some demand related changes in the inferior leads but no evidence of ACS.  She had a CTPA which was negative for PE or any other acute intrathoracic pathology.  ED provider discussed patient with cardiology, plan to hobs patient overnight and will be seen in the morning.  Patient was admitted for further management of her SVT and syncope.  Assessment & Plan:   Active Problems:   SVT (supraventricular tachycardia) (HCC)   #1 SVT Questionable etiology.  Patient presented with a syncopal episode in the presence of chest pain and palpitations noted to be with symptomatic SVT status post conversion to normal sinus rhythm after 2 episodes of adenosine.  2D echo pending.  Cardiac enzymes elevated but seem to be flattening.  Patient seen in consultation by cardiology and has been started on long-acting Cardizem.  Lisinopril discontinued.  Follow.  2.  Syncope Secondary to problem #1.  2D echo pending.  3.  Hypertension Lisinopril discontinued and patient started on Cardizem per cardiology.  Outpatient follow-up.  4.  Diabetes mellitus type 2 Hemoglobin A1c 9.8 (02/11/2020).  Blood glucose level of 248 this morning.  Sliding scale insulin.  Diabetes coordinator following.  Likely need Metformin 500 mg twice daily on discharge in addition to a glucose monitoring kit and outpatient diabetes follow-up.  5.  Leukocytosis Likely reactive.  Chest  x-ray negative for any acute cardiopulmonary infiltrate.  Patient however with some complaints of congestion.  Afebrile.  Check a UA with cultures and sensitivities.  Follow.   DVT prophylaxis: Lovenox Code Status: Full Family Communication: Updated patient.  No family at bedside. Disposition:   Status is: Observation    Dispo: The patient is from: Home              Anticipated d/c is to:  Home              Anticipated d/c date is: 1 to 2 days.              Patient currently being treated for SVT.  Work-up pending including 2D echo.  Cardiology following.  Not stable for discharge.       Consultants:   Cardiology Dr. Ubaldo Glassing 02/11/2020.  Procedures:   2D echo pending 02/11/2020  CT angiogram chest 02/10/2020  Chest x-ray 02/10/2020    Antimicrobials:   None   Subjective: On gurney in the ED.  On the telephone.  Denies any chest pain or shortness of breath.  Feeling better than she did on admission.  No further syncopal episodes.  Stated she just saw the cardiologist who said she was changing her antihypertensive medications.  Objective: Vitals:   02/11/20 0500 02/11/20 0600 02/11/20 0700 02/11/20 0815  BP: 130/74 130/76 (!) 140/96 136/89  Pulse: 79 79 82 81  Resp: (!) 21 20 17  (!) 22  Temp:      TempSrc:      SpO2: 99% 100% 98% 96%  Weight:      Height:       No intake or output data in the 24 hours ending 02/11/20 0926 Filed Weights   02/10/20 1450  Weight: 79.4 kg    Examination:  General exam: Appears calm and comfortable  Respiratory system: Clear to auscultation. Respiratory effort normal. Cardiovascular system: S1 & S2 heard, RRR. No JVD, murmurs, rubs, gallops or clicks. No pedal edema. Gastrointestinal system: Abdomen is nondistended, soft and nontender. No organomegaly or masses felt. Normal bowel sounds heard. Central nervous system: Alert and oriented. No focal neurological deficits. Extremities: Symmetric 5 x 5 power. Skin: No rashes, lesions or ulcers Psychiatry: Judgement and insight appear normal. Mood & affect appropriate.     Data Reviewed: I have personally reviewed following labs and imaging studies  CBC: Recent Labs  Lab 02/10/20 1452 02/11/20 0400  WBC 13.7* 14.8*  NEUTROABS 11.3* 10.2*  HGB 9.9* 10.0*  HCT 32.5* 32.5*  MCV 74.2* 75.1*  PLT 273 381    Basic Metabolic Panel: Recent Labs  Lab 02/10/20 1452  02/11/20 0400  NA 134* 134*  K 3.6 3.9  CL 98 100  CO2 24 23  GLUCOSE 355* 265*  BUN 10 13  CREATININE 0.52 0.68  CALCIUM 8.9 8.8*  MG 1.6* 2.1    GFR: Estimated Creatinine Clearance: 94.5 mL/min (by C-G formula based on SCr of 0.68 mg/dL).  Liver Function Tests: Recent Labs  Lab 02/10/20 1452  AST 23  ALT 16  ALKPHOS 90  BILITOT 0.6  PROT 8.2*  ALBUMIN 4.1    CBG: Recent Labs  Lab 02/10/20 2132 02/11/20 0731  GLUCAP 283* 248*     Recent Results (from the past 240 hour(s))  SARS Coronavirus 2 by RT PCR (hospital order, performed in [facility] hospital lab) Nasopharyngeal Nasopharyngeal Swab     Status: None   Collection Time: 02/10/20  4:25 PM   Specimen:  Nasopharyngeal Swab  Result Value Ref Range Status   SARS Coronavirus 2 NEGATIVE NEGATIVE Final    Comment: (NOTE) SARS-CoV-2 target nucleic acids are NOT DETECTED.  The SARS-CoV-2 RNA is generally detectable in upper and lower respiratory specimens during the acute phase of infection. The lowest concentration of SARS-CoV-2 viral copies this assay can detect is 250 copies / mL. A negative result does not preclude SARS-CoV-2 infection and should not be used as the sole basis for treatment or other patient management decisions.  A negative result may occur with improper specimen collection / handling, submission of specimen other than nasopharyngeal swab, presence of viral mutation(s) within the areas targeted by this assay, and inadequate number of viral copies (<250 copies / mL). A negative result must be combined with clinical observations, patient history, and epidemiological information.  Fact Sheet for Patients:   StrictlyIdeas.no  Fact Sheet for Healthcare Providers: BankingDealers.co.za  This test is not yet approved or  cleared by the [place] FDA and has been authorized for detection and/or diagnosis of SARS-CoV-2 by FDA under an Emergency  Use Authorization (EUA).  This EUA will remain in effect (meaning this test can be used) for the duration of the COVID-19 declaration under Section 564(b)(1) of the Act, 21 U.S.C. section 360bbb-3(b)(1), unless the authorization is terminated or revoked sooner.  Performed at [facility], [address] [place], [place] [address]          Radiology Studies: CT Angio Chest PE W and/or Wo Contrast  Result Date: 02/10/2020 CLINICAL DATA:  Syncope, supraventricular tachycardia EXAM: CT ANGIOGRAPHY CHEST WITH CONTRAST TECHNIQUE: Multidetector CT imaging of the chest was performed using the standard protocol during bolus administration of intravenous contrast. Multiplanar CT image reconstructions and MIPs were obtained to evaluate the vascular anatomy. CONTRAST:  52m OMNIPAQUE IOHEXOL 350 MG/ML SOLN COMPARISON:  None. FINDINGS: Cardiovascular: Satisfactory opacification of the pulmonary arteries to the segmental level. No evidence of pulmonary embolism. Normal heart size. No pericardial effusion. Mediastinum/Nodes: No enlarged mediastinal, hilar, or axillary lymph nodes. Thyroid gland, trachea, and esophagus demonstrate no significant findings. Lungs/Pleura: Lungs are clear. No pleural effusion or pneumothorax. Upper Abdomen: No acute abnormality. Musculoskeletal: No chest wall abnormality. No acute or significant osseous findings. Review of the MIP images confirms the above findings. IMPRESSION: Negative examination for pulmonary embolism. No acute intrathoracic process identified. Electronically Signed   By: AFidela SalisburyMD   On: 02/10/2020 18:03   DG Chest Portable 1 View  Result Date: 02/10/2020 CLINICAL DATA:  New onset arrhythmia, syncope, last occult walking to the bathroom, waking up on floor. EXAM: PORTABLE CHEST 1 VIEW COMPARISON:  Radiograph 04/15/2019 FINDINGS: Hazy opacities towards the lung bases with some low volumes and central vascular crowding could reflect atelectatic  change. Slight prominence of the pulmonary arteries is noted though possibly accentuated by the low volumes and portable technique. No pneumothorax. No effusion. No focal consolidative process. No convincing features of edema. Remaining cardiomediastinal contours are unremarkable. No acute osseous or soft tissue abnormality. Telemetry leads overlie the chest. IMPRESSION: 1. Hazy opacities towards the lung bases with some low volumes and central vascular crowding favor atelectatic change. 2. Slight prominence of the pulmonary arteries is noted though possibly accentuated by the low volumes and portable technique. Electronically Signed   By: PLovena LeM.D.   On: 02/10/2020 16:02        Scheduled Meds: . enoxaparin (LOVENOX) injection  40 mg Subcutaneous Q24H  . fluticasone  2 spray  Each Nare Daily  . insulin aspart  0-15 Units Subcutaneous TID WC  . insulin aspart  0-5 Units Subcutaneous QHS  . lisinopril  10 mg Oral Daily   Continuous Infusions:   LOS: 0 days    Time spent: 35 minutes    Irine Seal, MD [facility]   To contact the attending provider between 7A-7P or the covering provider during after hours 7P-7A, please log into the web site [email or web address] and access using universal [place] password for that web site. If you do not have the password, please call the hospital operator.  02/11/2020, 9:26 AM

## 2020-02-11 NOTE — ED Notes (Signed)
Pt unhooked from monitor at this time to go to the bathroom, pt back to bed without incident.

## 2020-02-11 NOTE — ED Notes (Signed)
Pt provided with meal tray at this time 

## 2020-02-11 NOTE — ED Notes (Signed)
Medications administered as ordered. Pt tolerated well. Pt given another blanket for her head per her request. Pt denies further needs. Lights turned off for patient comfort. Call bell remains within reach of patient.

## 2020-02-11 NOTE — Consult Note (Signed)
CARDIOLOGY CONSULT NOTE               Patient ID: Helen Webster MRN: [id number] DOB/AGE: Jul 20, 1975 44 y.o.  Admit date: 02/10/2020 Referring Physician Dionne Bucy  Primary Physician [place] [facility]  Primary Cardiologist N/A Reason for Consultation SVT  HPI: Helen Webster is a 44 year old female with a past medical history significant for type 2 diabetes and hypertension who presented to the ED for an acute onset of chest pain and palpitations that lasted approximately 10 minutes and resolved with rest.  She had another episode a few hours later which caused her to pass out.  EMS was called and she was transported to the ED.  Per reports, EMS noted her to be in SVT, received adenosine x 2, and converted to NSR. Workup in the ED was significant for WBC of 13.7, hemoglobin of 9.9, glucose of 355, magnesium of 1.6, high sensitivity troponin 27, 71, 60, and 61 respectively, and chest CT negative for a PE.   02/11/20: She remains in sinus rhythm and is asymptomatic from a cardiac standpoint, denying recurrent chest pain, palpitations, dizziness, lightheadedness, or syncopal/presyncopal episodes. She also denies lower extremity swelling, orthopnea, or PND. She reports a month long history of intermittent palpitations, more noticeable with sinus pressure. Her biggest concern now is a headache and sinus pressure.   Review of systems complete and found to be negative unless listed above     Past Medical History:  Diagnosis Date  . COVID-19   . Diabetes mellitus without complication (HCC)   . Hypertension   . Migraine     Past Surgical History:  Procedure Laterality Date  . CESAREAN SECTION      (Not in a hospital admission)  Social History   Socioeconomic History  . Marital status: Married    Spouse name: Not on file  . Number of children: Not on file  . Years of education: Not on file  . Highest education level: Not on file  Occupational History  . Not on file    Tobacco Use  . Smoking status: Never Smoker  . Smokeless tobacco: Never Used  Vaping Use  . Vaping Use: Never used  Substance and Sexual Activity  . Alcohol use: No  . Drug use: No  . Sexual activity: Not on file  Other Topics Concern  . Not on file  Social History Narrative  . Not on file   Social Determinants of Health   Financial Resource Strain:   . Difficulty of Paying Living Expenses: Not on file  Food Insecurity:   . Worried About Programme researcher, broadcasting/film/video in the Last Year: Not on file  . Ran Out of Food in the Last Year: Not on file  Transportation Needs:   . Lack of Transportation (Medical): Not on file  . Lack of Transportation (Non-Medical): Not on file  Physical Activity:   . Days of Exercise per Week: Not on file  . Minutes of Exercise per Session: Not on file  Stress:   . Feeling of Stress : Not on file  Social Connections:   . Frequency of Communication with Friends and Family: Not on file  . Frequency of Social Gatherings with Friends and Family: Not on file  . Attends Religious Services: Not on file  . Active Member of Clubs or Organizations: Not on file  . Attends Banker Meetings: Not on file  . Marital Status: Not on file  Intimate Partner Violence:   .  Fear of Current or Ex-Partner: Not on file  . Emotionally Abused: Not on file  . Physically Abused: Not on file  . Sexually Abused: Not on file    History reviewed. No pertinent family history.    Review of systems complete and found to be negative unless listed above      PHYSICAL EXAM  General: Well developed, well nourished, in no acute distress HEENT:  Normocephalic and atramatic Neck:  No JVD.  Lungs: Clear bilaterally to auscultation and percussion. Heart: HRRR . Normal S1 and S2 without gallops or murmurs.  Abdomen: Bowel sounds are positive, abdomen soft and non-tender  Msk:  Back normal. Normal strength and tone for age. Extremities: No clubbing, cyanosis or edema.    Neuro: Alert and oriented X 3. Psych:  Good affect, responds appropriately  Labs:   Lab Results  Component Value Date   WBC 14.8 (H) 02/11/2020   HGB 10.0 (L) 02/11/2020   HCT 32.5 (L) 02/11/2020   MCV 75.1 (L) 02/11/2020   PLT 302 02/11/2020    Recent Labs  Lab 02/10/20 1452 02/10/20 1452 02/11/20 0400  NA 134*   < > 134*  K 3.6   < > 3.9  CL 98   < > 100  CO2 24   < > 23  BUN 10   < > 13  CREATININE 0.52   < > 0.68  CALCIUM 8.9   < > 8.8*  PROT 8.2*  --   --   BILITOT 0.6  --   --   ALKPHOS 90  --   --   ALT 16  --   --   AST 23  --   --   GLUCOSE 355*   < > 265*   < > = values in this interval not displayed.   Lab Results  Component Value Date   CKTOTAL 89 12/18/2012   CKMB < 0.5 (L) 12/18/2012   TROPONINI <0.03 02/24/2017   No results found for: CHOL No results found for: HDL No results found for: LDLCALC No results found for: TRIG No results found for: CHOLHDL No results found for: LDLDIRECT    Radiology: CT Angio Chest PE W and/or Wo Contrast  Result Date: 02/10/2020 CLINICAL DATA:  Syncope, supraventricular tachycardia EXAM: CT ANGIOGRAPHY CHEST WITH CONTRAST TECHNIQUE: Multidetector CT imaging of the chest was performed using the standard protocol during bolus administration of intravenous contrast. Multiplanar CT image reconstructions and MIPs were obtained to evaluate the vascular anatomy. CONTRAST:  61mL OMNIPAQUE IOHEXOL 350 MG/ML SOLN COMPARISON:  None. FINDINGS: Cardiovascular: Satisfactory opacification of the pulmonary arteries to the segmental level. No evidence of pulmonary embolism. Normal heart size. No pericardial effusion. Mediastinum/Nodes: No enlarged mediastinal, hilar, or axillary lymph nodes. Thyroid gland, trachea, and esophagus demonstrate no significant findings. Lungs/Pleura: Lungs are clear. No pleural effusion or pneumothorax. Upper Abdomen: No acute abnormality. Musculoskeletal: No chest wall abnormality. No acute or significant  osseous findings. Review of the MIP images confirms the above findings. IMPRESSION: Negative examination for pulmonary embolism. No acute intrathoracic process identified. Electronically Signed   By: Helyn Numbers MD   On: 02/10/2020 18:03   DG Chest Portable 1 View  Result Date: 02/10/2020 CLINICAL DATA:  New onset arrhythmia, syncope, last occult walking to the bathroom, waking up on floor. EXAM: PORTABLE CHEST 1 VIEW COMPARISON:  Radiograph 04/15/2019 FINDINGS: Hazy opacities towards the lung bases with some low volumes and central vascular crowding could reflect atelectatic change. Slight prominence of  the pulmonary arteries is noted though possibly accentuated by the low volumes and portable technique. No pneumothorax. No effusion. No focal consolidative process. No convincing features of edema. Remaining cardiomediastinal contours are unremarkable. No acute osseous or soft tissue abnormality. Telemetry leads overlie the chest. IMPRESSION: 1. Hazy opacities towards the lung bases with some low volumes and central vascular crowding favor atelectatic change. 2. Slight prominence of the pulmonary arteries is noted though possibly accentuated by the low volumes and portable technique. Electronically Signed   By: Kreg Shropshire M.D.   On: 02/10/2020 16:02    EKG: Sinus tachycardia, rate of 111bpm; no obvious evidence of acute ischemia   ASSESSMENT AND PLAN:  1.  SVT  -Converted to NSR on adenosine x 2; remains in sinus rhythm  -Will start long acting diltiazem 120mg    -Echocardiogram pending   -Will place Holter monitor prior to discharge and have her follow up with Dr. or Lady Gary PA-C within 7-10 days of discharge   2.  Hypertension   -Previously untreated, remains somewhat hypertensive now   -Will start diltiazem for SVT and discontinue lisinopril   3.  Elevated troponin   -Borderline elevated with no significant delta; likely demand ischemia is the setting SVT   -No further  invasive workup indicated at this time   4.  Type 2 diabetes  -With no prior history; Hemoglobin A1C pending   The history, physical exam findings, and plan of care were all discussed with Dr. 9-10, and all decision making was made in collaboration.   Signed: Harold Hedge PA-C 02/11/2020, 8:30 AM

## 2020-02-11 NOTE — Progress Notes (Signed)
Inpatient Diabetes Program Recommendations  AACE/ADA: New Consensus Statement on Inpatient Glycemic Control   Target Ranges:  Prepandial:   less than 140 mg/dL      Peak postprandial:   less than 180 mg/dL (1-2 hours)      Critically ill patients:  140 - 180 mg/dL   Results for Helen Webster, Helen Webster (MRN [id number]) as of 02/11/2020 10:12  Ref. Range 02/09/2017 05:15 02/09/2017 06:13 02/09/2017 07:16 02/10/2020 21:32 02/11/2020 07:31  Glucose-Capillary Latest Ref Range: 70 - 99 mg/dL 301 (H) 291 (H) 265 (H) 283 (H) 248 (H)  Results for Helen Webster, Helen Webster (MRN [id number]) as of 02/11/2020 11:08  Ref. Range 02/11/2020 04:00  Hemoglobin A1C Latest Ref Range: 4.8 - 5.6 % 9.8 (H)   Review of Glycemic Control  Diabetes history: DM2 Outpatient Diabetes medications: None Current orders for Inpatient glycemic control: Novolog 0-15 units TID with meals, Novolog 0-5 units QHS  Inpatient Diabetes Program Recommendations:    Oral DM meds: Please consider ordering Metformin 500 mg BID.   HbgA1C:  A1C 9.8% on 02/11/20 indicating an average glucose of 235 mg/dl over the past 2-3 months. Please provide Rx for: glucose monitoring kit (#[id number]) and DM medication(s) at time of discharge.  NOTE: Spoke with patient over the phone about DM.  Patient reports that she had GDM during pregnancy in 2016. Patient states that she was initially on Metformin after delivery but she has not taken any Metformin in years. Patient states that she does not check her glucose at home and she has never been told she had DM2.  Discussed initial glucose of 355 mg/dl so an A1C was checked (patient is familiar with what an A1C is).   Discussed A1C results (9.8% on 02/11/20) and informed patient that current A1C indicates an average glucose of 235 mg/dl over the past 2-3 months. Discussed DM Type 2, basic home care, importance of checking CBGs and maintaining good CBG control to prevent long-term and short-term complications. Reviewed glucose and A1C goals.  Discussed  impact of nutrition, exercise, stress, sickness, and medications on diabetes control. Patient has not been following any diet. Encouraged patient to follow Carb Modified diet. Will order RD consult for further diet education while inpatient. Informed patient that a Living Well with diabetes booklet would be ordered and encouraged her to read through entire book once received. Informed patient that she will need to start checking glucose and taking DM medication consistently as an outpatient. Explained that while inpatient, insulin would be ordered for glycemic control.  Patient states that she had a appointment with a provider today to establish care but she had to cancel the appointment due to being in the hospital. She plans to call back after discharged and reschedule. Patient feels she would benefit from outpatient DM education as well so order will be placed with MD cosign required.  Encouraged patient to check glucose as directed at time of discharge and to keep a log book of glucose readings. Explained how the doctor she follows up with can use the log book to continue to make adjustments with DM medications if needed.  Reviewed hypoglycemia along with treatment. Patient reports that she did experience hypoglycemia during her pregnancy so she is familiar with symptoms and treatment.   Patient verbalized understanding of information discussed and she states that she has no further questions at this time related to diabetes.   RNs to provide ongoing basic DM education at bedside with this patient and engage patient to actively check blood glucose.  Thanks, Barnie Alderman, RN, MSN, CDE Diabetes Coordinator Inpatient Diabetes Program [phone] (Team Pager from 8am to 5pm)

## 2020-02-11 NOTE — ED Notes (Signed)
Cardiologist at bedside at this time.

## 2020-02-12 ENCOUNTER — Encounter: Payer: Self-pay | Admitting: Family Medicine

## 2020-02-12 DIAGNOSIS — E669 Obesity, unspecified: Secondary | ICD-10-CM | POA: Diagnosis not present

## 2020-02-12 DIAGNOSIS — E785 Hyperlipidemia, unspecified: Secondary | ICD-10-CM | POA: Diagnosis present

## 2020-02-12 DIAGNOSIS — I471 Supraventricular tachycardia: Secondary | ICD-10-CM | POA: Diagnosis not present

## 2020-02-12 DIAGNOSIS — R55 Syncope and collapse: Secondary | ICD-10-CM | POA: Diagnosis not present

## 2020-02-12 LAB — CBC WITH DIFFERENTIAL/PLATELET
Abs Immature Granulocytes: 0.05 10*3/uL (ref 0.00–0.07)
Basophils Absolute: 0.1 10*3/uL (ref 0.0–0.1)
Basophils Relative: 0 %
Eosinophils Absolute: 0.1 10*3/uL (ref 0.0–0.5)
Eosinophils Relative: 1 %
HCT: 32.4 % — ABNORMAL LOW (ref 36.0–46.0)
Hemoglobin: 9.9 g/dL — ABNORMAL LOW (ref 12.0–15.0)
Immature Granulocytes: 0 %
Lymphocytes Relative: 28 %
Lymphs Abs: 3.7 10*3/uL (ref 0.7–4.0)
MCH: 23 pg — ABNORMAL LOW (ref 26.0–34.0)
MCHC: 30.6 g/dL (ref 30.0–36.0)
MCV: 75.3 fL — ABNORMAL LOW (ref 80.0–100.0)
Monocytes Absolute: 0.6 10*3/uL (ref 0.1–1.0)
Monocytes Relative: 5 %
Neutro Abs: 8.5 10*3/uL — ABNORMAL HIGH (ref 1.7–7.7)
Neutrophils Relative %: 66 %
Platelets: 286 10*3/uL (ref 150–400)
RBC: 4.3 MIL/uL (ref 3.87–5.11)
RDW: 14.5 % (ref 11.5–15.5)
WBC: 12.9 10*3/uL — ABNORMAL HIGH (ref 4.0–10.5)
nRBC: 0 % (ref 0.0–0.2)

## 2020-02-12 LAB — BASIC METABOLIC PANEL
Anion gap: 12 (ref 5–15)
BUN: 12 mg/dL (ref 6–20)
CO2: 23 mmol/L (ref 22–32)
Calcium: 8.7 mg/dL — ABNORMAL LOW (ref 8.9–10.3)
Chloride: 101 mmol/L (ref 98–111)
Creatinine, Ser: 0.65 mg/dL (ref 0.44–1.00)
GFR calc Af Amer: >60 mL/min (ref >60)
GFR calc non Af Amer: >60 mL/min (ref >60)
Glucose, Bld: 162 mg/dL — ABNORMAL HIGH (ref 70–99)
Potassium: 3.7 mmol/L (ref 3.5–5.1)
Sodium: 136 mmol/L (ref 135–145)

## 2020-02-12 LAB — LIPID PANEL
Cholesterol: 196 mg/dL (ref 0–200)
HDL: 35 mg/dL — ABNORMAL LOW (ref >40)
LDL Cholesterol: 129 mg/dL — ABNORMAL HIGH (ref 0–99)
Total CHOL/HDL Ratio: 5.6 RATIO
Triglycerides: 160 mg/dL — ABNORMAL HIGH (ref <150)
VLDL: 32 mg/dL (ref 0–40)

## 2020-02-12 LAB — GLUCOSE, CAPILLARY
Glucose-Capillary: 186 mg/dL — ABNORMAL HIGH (ref 70–99)
Glucose-Capillary: 202 mg/dL — ABNORMAL HIGH (ref 70–99)

## 2020-02-12 MED ORDER — GLIPIZIDE 5 MG PO TABS: 5.0000 mg | ORAL_TABLET | Freq: Every day | ORAL | 0 refills | Status: DC

## 2020-02-12 MED ORDER — LOSARTAN POTASSIUM 25 MG PO TABS
25.0000 mg | ORAL_TABLET | Freq: Once | ORAL | Status: AC
  Administered 2020-02-12: 25 mg via ORAL
  Filled 2020-02-12: qty 1

## 2020-02-12 MED ORDER — DILTIAZEM HCL ER COATED BEADS 120 MG PO CP24: 120.0000 mg | ORAL_CAPSULE | Freq: Every day | ORAL | 0 refills | Status: AC

## 2020-02-12 MED ORDER — BLOOD GLUCOSE METER KIT: PACK | 0 refills | Status: AC

## 2020-02-12 MED ORDER — LOSARTAN POTASSIUM 25 MG PO TABS: 25.0000 mg | ORAL_TABLET | Freq: Every day | ORAL | 0 refills | Status: AC

## 2020-02-12 MED ORDER — METFORMIN HCL 500 MG PO TABS
500.0000 mg | ORAL_TABLET | Freq: Two times a day (BID) | ORAL | 0 refills | Status: DC
Start: 2020-02-12 — End: 2020-10-08

## 2020-02-12 NOTE — Discharge Summary (Signed)
Physician Discharge Summary  Helen Webster RFV:[id number] DOB: [birth year] DOA: 02/10/2020  PCP: [place] date: 02/10/2020 Discharge date: 02/12/2020  Discharge disposition: Home   Recommendations for Outpatient Follow-Up:   Follow-up with cardiologist in 7 to 10 days Follow-up with PCP in 1 to 2 weeks   Discharge Diagnosis:   Principal Problem:   SVT (supraventricular tachycardia) ([place]) Active Problems:   DM (diabetes mellitus), type 2 ([place])   Syncope   HTN (hypertension)    Discharge Condition: Stable.  Diet recommendation:  Diet Order            Diet - low sodium heart healthy           Diet Carb Modified           Diet Carb Modified Fluid consistency: Thin; Room service appropriate? Yes  Diet effective now                   Code Status: Full Code     Hospital Course:   Ms. Helen Webster is a 44 year old woman with medical history significant for gestational diabetes, hypertension, obesity, who presented to the hospital with chest pain, palpitations and syncope. Reportedly, she was in SVT when EMS picked her up, and SVT was terminated with 2 rounds of adenosine.-SVT was not present when she got to the emergency room.  She was admitted to progressive cardiac unit for observation. She was seen by the cardiologist who recommended outpatient Holter monitor at discharge. Her condition has improved. She is deemed stable for cardiology standpoint.  Patient was found to be hyperglycemic. She said she had used Metformin in the past for gestational diabetes but she has been advised to discontinue Metformin few years ago because her sugar had improved. Hemoglobin A1c is 9.2. Patient has been informed that she has full-blown type 2 diabetes mellitus and she was educated on the management of diabetes mellitus including complications of treatment. She has been started on Metformin and glipizide for glucose control. She was also given a prescription for  glucometer. She was seen in consultation by the diabetes educator as well.  Her blood pressure is also uncontrolled so she was started on losartan. Cardizem was prescribed for SVT. Blood work also showed she has dyslipidemia (low HDL of 35, mildly elevated LDL of 129 total cholesterol 196). The importance of weight loss, regular exercise, healthy eating habits and medical adherence was emphasized.    Medical Consultants:   Cardiologist  Discharge Exam:    Vitals:   02/11/20 2224 02/12/20 0539 02/12/20 0724 02/12/20 1104  BP: (!) 141/88 (!) 156/95 (!) 147/98 (!) 153/102  Pulse: 78 72 80 79  Resp: _0 Temp: 98.5 F (36.9 C) 98.3 F (36.8 C) 99.1 F (37.3 C) 98.7 F (37.1 C)  TempSrc: Oral  Oral Oral  SpO2: 100% 99% 99% 98%  Weight:      Height:         GEN: NAD SKIN: No rash EYES: EOMI ENT: MMM CV: RRR PULM: CTA B ABD: soft, obese, NT, +BS CNS: AAO x 3, non focal EXT: No edema or tenderness   The results of significant diagnostics from this hospitalization (including imaging, microbiology, ancillary and laboratory) are listed below for reference.     Procedures and Diagnostic Studies:   No results found.   Labs:   Basic Metabolic Panel: Recent Labs  Lab 02/10/20 1452 02/10/20 1452 02/11/20 0400 02/12/20 0516  NA 134*  --  134* 136  K 3.6   < > 3.9 3.7  CL 98  --  100 101  CO2 24  --  23 23  GLUCOSE 355*  --  265* 162*  BUN 10  --  13 12  CREATININE 0.52  --  0.68 0.65  CALCIUM 8.9  --  8.8* 8.7*  MG 1.6*  --  2.1  --    < > = values in this interval not displayed.   GFR Estimated Creatinine Clearance: 97.1 mL/min (by C-G formula based on SCr of 0.65 mg/dL). Liver Function Tests: Recent Labs  Lab 02/10/20 1452  AST 23  ALT 16  ALKPHOS 90  BILITOT 0.6  PROT 8.2*  ALBUMIN 4.1   No results for input(s): LIPASE, AMYLASE in the last 168 hours. No results for input(s): AMMONIA in the last 168 hours. Coagulation profile No results  for input(s): INR, PROTIME in the last 168 hours.  CBC: Recent Labs  Lab 02/10/20 1452 02/11/20 0400 02/12/20 0516  WBC 13.7* 14.8* 12.9*  NEUTROABS 11.3* 10.2* 8.5*  HGB 9.9* 10.0* 9.9*  HCT 32.5* 32.5* 32.4*  MCV 74.2* 75.1* 75.3*  PLT 273 302 286   Cardiac Enzymes: No results for input(s): CKTOTAL, CKMB, CKMBINDEX, TROPONINI in the last 168 hours. BNP: Invalid input(s): POCBNP CBG: Recent Labs  Lab 02/11/20 1147 02/11/20 1624 02/11/20 2053 02/12/20 0726 02/12/20 1105  GLUCAP 200* 216* 181* 186* 202*   D-Dimer No results for input(s): DDIMER in the last 72 hours. Hgb A1c Recent Labs    02/11/20 0400  HGBA1C 9.8*   Lipid Profile Recent Labs    02/12/20 0535  CHOL 196  HDL 35*  LDLCALC 129*  TRIG 160*  CHOLHDL 5.6   Thyroid function studies Recent Labs    02/10/20 1452  TSH 1.167   Anemia work up No results for input(s): VITAMINB12, FOLATE, FERRITIN, TIBC, IRON, RETICCTPCT in the last 72 hours. Microbiology Recent Results (from the past 240 hour(s))  SARS Coronavirus 2 by RT PCR (hospital order, performed in [facility] hospital lab) Nasopharyngeal Nasopharyngeal Swab     Status: None   Collection Time: 02/10/20  4:25 PM   Specimen: Nasopharyngeal Swab  Result Value Ref Range Status   SARS Coronavirus 2 NEGATIVE NEGATIVE Final    Comment: (NOTE) SARS-CoV-2 target nucleic acids are NOT DETECTED.  The SARS-CoV-2 RNA is generally detectable in upper and lower respiratory specimens during the acute phase of infection. The lowest concentration of SARS-CoV-2 viral copies this assay can detect is 250 copies / mL. A negative result does not preclude SARS-CoV-2 infection and should not be used as the sole basis for treatment or other patient management decisions.  A negative result may occur with improper specimen collection / handling, submission of specimen other than nasopharyngeal swab, presence of viral mutation(s) within the areas targeted by this  assay, and inadequate number of viral copies (<250 copies / mL). A negative result must be combined with clinical observations, patient history, and epidemiological information.  Fact Sheet for Patients:   StrictlyIdeas.no  Fact Sheet for Healthcare Providers: BankingDealers.co.za  This test is not yet approved or  cleared by the [place] FDA and has been authorized for detection and/or diagnosis of SARS-CoV-2 by FDA under an Emergency Use Authorization (EUA).  This EUA will remain in effect (meaning this test can be used) for the duration of the COVID-19 declaration under Section 564(b)(1) of the Act, 21 U.S.C. section 360bbb-3(b)(1), unless the authorization is terminated  or revoked sooner.  Performed at [facility], [address], [place], [place] [address]      Discharge Instructions:   Discharge Instructions    Ambulatory referral to Nutrition and Diabetic Education   Complete by: As directed    GDM hx; new DM2 dx; A1C 9.8% on 02/11/20   Diet - low sodium heart healthy   Complete by: As directed    Diet Carb Modified   Complete by: As directed    Increase activity slowly   Complete by: As directed      Allergies as of 02/12/2020   No Known Allergies     Medication List    STOP taking these medications   amLODipine 5 MG tablet Commonly known as: NORVASC   amoxicillin 875 MG tablet Commonly known as: AMOXIL   benzonatate 200 MG capsule Commonly known as: TESSALON   chlorpheniramine-HYDROcodone 10-8 MG/5ML Suer Commonly known as: Tussionex Pennkinetic ER   cyclobenzaprine 7.5 MG tablet Commonly known as: FEXMID   ibuprofen 600 MG tablet Commonly known as: ADVIL   lidocaine 5 % Commonly known as: Lidoderm   meloxicam 15 MG tablet Commonly known as: MOBIC   methocarbamol 500 MG tablet Commonly known as: Robaxin   methylPREDNISolone 4 MG Tbpk tablet Commonly known as: MEDROL DOSEPAK     naproxen 500 MG tablet Commonly known as: Naprosyn   ondansetron 4 MG disintegrating tablet Commonly known as: Zofran ODT   prochlorperazine 10 MG tablet Commonly known as: COMPAZINE   tobramycin 0.3 % ophthalmic solution Commonly known as: TOBREX   traMADol 50 MG tablet Commonly known as: Ultram     TAKE these medications   blood glucose meter kit and supplies Dispense based on patient and insurance preference. Use up to four times daily as directed. (FOR ICD-10 E10.9, E11.9).   diltiazem 120 MG 24 hr capsule Commonly known as: CARDIZEM CD Take 1 capsule (120 mg total) by mouth daily.   glipiZIDE 5 MG tablet Commonly known as: Glucotrol Take 1 tablet (5 mg total) by mouth daily before breakfast.   losartan 25 MG tablet Commonly known as: Cozaar Take 1 tablet (25 mg total) by mouth daily.   metFORMIN 500 MG tablet Commonly known as: Glucophage Take 1 tablet (500 mg total) by mouth 2 (two) times daily with a meal. What changed: when to take this       Follow-up Information    Teodoro Spray, MD Follow up.   Specialty: Cardiology Why: Follow up with Dr. Bartholome Bill or Doristine Mango PA-C within 7-10 days of discharge Contact information: [place] [address] [phone]                Time coordinating discharge: 35 minutes  Signed:  Jennye Boroughs  [facility] 02/12/2020, 11:35 AM   Pager on [email or web address]. If 7PM-7AM, please contact night-coverage at [email or web address]

## 2020-02-12 NOTE — Progress Notes (Signed)
[facility]    SUBJECTIVE: Helen Webster is a 44 year old female with a past medical history significant for newly diagnosed type 2 diabetes and hypertension who presented to the ED for an acute onset of chest pain and palpitations that lasted approximately 10 minutes and resolved with rest.  She had another episode a few hours later which caused her to pass out.  EMS was called and she was transported to the ED.  Per reports, EMS noted her to be in SVT, received adenosine x 2, and converted to NSR. Workup in the ED was significant for WBC of 13.7, hemoglobin of 9.9, glucose of 355, magnesium of 1.6, high sensitivity troponin 27, 71, 60, and 61 respectively, and chest CT negative for a PE.   02/11/20: She remains in sinus rhythm and is asymptomatic from a cardiac standpoint, denying recurrent chest pain, palpitations, dizziness, lightheadedness, or syncopal/presyncopal episodes. She also denies lower extremity swelling, orthopnea, or PND. She reports a month long history of intermittent palpitations, more noticeable with sinus pressure. Her biggest concern now is a headache and sinus pressure.   02/12/20: Helen Webster continues to do well from a cardiac standpoint and denies any recurrent palpitations, heart racing, dizziness, lightheadedness, or syncopal/presyncopal episodes.  She also denies chest pain, chest pressure, shortness of breath, lower extremity swelling, orthopnea, or PND.  She continues to experience sinus pain/pressure.    Vitals:   02/11/20 1558 02/11/20 2224 02/12/20 0539 02/12/20 0724  BP: (!) 165/113 (!) 141/88 (!) 156/95 (!) 147/98  Pulse: 86 78 72 80  Resp: 18 20 18 18   Temp: 98.7 F (37.1 C) 98.5 F (36.9 C) 98.3 F (36.8 C) 99.1 F (37.3 C)  TempSrc: Oral Oral  Oral  SpO2: 100% 100% 99% 99%  Weight:      Height:         Intake/Output Summary (Last 24 hours) at 02/12/2020 04/13/2020 Last data filed at 02/12/2020 0200 Gross per 24 hour  Intake -  Output 0 ml  Net 0 ml       PHYSICAL EXAM  General: Well developed, well nourished, in no acute distress HEENT:  Normocephalic and atramatic Neck:  No JVD.  Lungs: Clear bilaterally to auscultation and percussion. Heart: HRRR . Normal S1 and S2 without gallops or murmurs.  Abdomen: Bowel sounds are positive, abdomen soft and non-tender  Msk:  Back normal, normal gait. Normal strength and tone for age. Extremities: No clubbing, cyanosis or edema.   Neuro: Alert and oriented X 3. Psych:  Good affect, responds appropriately   LABS: Basic Metabolic Panel: Recent Labs    02/10/20 1452 02/10/20 1452 02/11/20 0400 02/12/20 0516  NA 134*   < > 134* 136  K 3.6   < > 3.9 3.7  CL 98   < > 100 101  CO2 24   < > 23 23  GLUCOSE 355*   < > 265* 162*  BUN 10   < > 13 12  CREATININE 0.52   < > 0.68 0.65  CALCIUM 8.9   < > 8.8* 8.7*  MG 1.6*  --  2.1  --    < > = values in this interval not displayed.   Liver Function Tests: Recent Labs    02/10/20 1452  AST 23  ALT 16  ALKPHOS 90  BILITOT 0.6  PROT 8.2*  ALBUMIN 4.1   No results for input(s): LIPASE, AMYLASE in the last 72 hours. CBC: Recent Labs    02/11/20 0400  02/12/20 0516  WBC 14.8* 12.9*  NEUTROABS 10.2* 8.5*  HGB 10.0* 9.9*  HCT 32.5* 32.4*  MCV 75.1* 75.3*  PLT 302 286   Cardiac Enzymes: No results for input(s): CKTOTAL, CKMB, CKMBINDEX, TROPONINI in the last 72 hours. BNP: Invalid input(s): POCBNP D-Dimer: No results for input(s): DDIMER in the last 72 hours. Hemoglobin A1C: Recent Labs    02/11/20 0400  HGBA1C 9.8*   Fasting Lipid Panel: No results for input(s): CHOL, HDL, LDLCALC, TRIG, CHOLHDL, LDLDIRECT in the last 72 hours. Thyroid Function Tests: Recent Labs    02/10/20 1452  TSH 1.167   Anemia Panel: No results for input(s): VITAMINB12, FOLATE, FERRITIN, TIBC, IRON, RETICCTPCT in the last 72 hours.  CT Angio Chest PE W and/or Wo Contrast  Result Date: 02/10/2020 CLINICAL DATA:  Syncope, supraventricular  tachycardia EXAM: CT ANGIOGRAPHY CHEST WITH CONTRAST TECHNIQUE: Multidetector CT imaging of the chest was performed using the standard protocol during bolus administration of intravenous contrast. Multiplanar CT image reconstructions and MIPs were obtained to evaluate the vascular anatomy. CONTRAST:  19mL OMNIPAQUE IOHEXOL 350 MG/ML SOLN COMPARISON:  None. FINDINGS: Cardiovascular: Satisfactory opacification of the pulmonary arteries to the segmental level. No evidence of pulmonary embolism. Normal heart size. No pericardial effusion. Mediastinum/Nodes: No enlarged mediastinal, hilar, or axillary lymph nodes. Thyroid gland, trachea, and esophagus demonstrate no significant findings. Lungs/Pleura: Lungs are clear. No pleural effusion or pneumothorax. Upper Abdomen: No acute abnormality. Musculoskeletal: No chest wall abnormality. No acute or significant osseous findings. Review of the MIP images confirms the above findings. IMPRESSION: Negative examination for pulmonary embolism. No acute intrathoracic process identified. Electronically Signed   By: Helyn Numbers MD   On: 02/10/2020 18:03   DG Chest Portable 1 View  Result Date: 02/10/2020 CLINICAL DATA:  New onset arrhythmia, syncope, last occult walking to the bathroom, waking up on floor. EXAM: PORTABLE CHEST 1 VIEW COMPARISON:  Radiograph 04/15/2019 FINDINGS: Hazy opacities towards the lung bases with some low volumes and central vascular crowding could reflect atelectatic change. Slight prominence of the pulmonary arteries is noted though possibly accentuated by the low volumes and portable technique. No pneumothorax. No effusion. No focal consolidative process. No convincing features of edema. Remaining cardiomediastinal contours are unremarkable. No acute osseous or soft tissue abnormality. Telemetry leads overlie the chest. IMPRESSION: 1. Hazy opacities towards the lung bases with some low volumes and central vascular crowding favor atelectatic change.  2. Slight prominence of the pulmonary arteries is noted though possibly accentuated by the low volumes and portable technique. Electronically Signed   By: Kreg Shropshire M.D.   On: 02/10/2020 16:02    TELEMETRY:  Normal sinus rhythm, rate in the low 80s   ASSESSMENT AND PLAN:  Principal Problem:   SVT (supraventricular tachycardia) (HCC) Active Problems:   DM (diabetes mellitus), type 2 (HCC)   Syncope   HTN (hypertension)   1.  SVT             -Converted to NSR on adenosine x 2; remains in sinus rhythm             -Started on diltiazem 120mg ; will discharge on this             -Will place Holter monitor prior to discharge and have her follow up with Dr. or Lady Gary PA-C within 7-10 days of discharge   2.  Hypertension              -Previously untreated, diltiazem initiated yesterday   -  Remains hypertension; will continue diltiazem and add losartan 25mg    3.  Elevated troponin              -Borderline elevated with no significant delta; likely demand ischemia is the setting SVT              -No further invasive workup indicated at this time   4.  Type 2 diabetes             -With no prior history; continue SSI    The history, physical exam findings, and plan of care were all discussed with Dr. , and all decision making was made in collaboration.   Harold Hedge  PA-C 02/12/2020 7:52 AM

## 2020-02-12 NOTE — Progress Notes (Signed)
Inpatient Diabetes Program Recommendations  AACE/ADA: New Consensus Statement on Inpatient Glycemic Control   Target Ranges:  Prepandial:   less than 140 mg/dL      Peak postprandial:   less than 180 mg/dL (1-2 hours)      Critically ill patients:  140 - 180 mg/dL   Results for Helen Webster, Helen Webster (MRN [id number]) as of 02/12/2020 11:09  Ref. Range 02/11/2020 07:31 02/11/2020 11:47 02/11/2020 16:24 02/11/2020 20:53 02/12/2020 07:26 02/12/2020 11:05  Glucose-Capillary Latest Ref Range: 70 - 99 mg/dL 248 (H) 200 (H) 216 (H) 181 (H) 186 (H) 202 (H)   Review of Glycemic Control  Diabetes history: GDM Outpatient Diabetes medications: None Current orders for Inpatient glycemic control: Novolog 0-15 units TID with meals, Novolog 0-5 units QHS   Inpatient Diabetes Program Recommendations:    Insulin: While inpatient, may want to consider ordering Lantus 5 units Q24H.  HbgA1C:  A1C 9.8% on 02/11/20 indicating an average glucose of 235 mg/dl over the past 2-3 months. Please provide Rx for: glucose monitoring kit (#[id number]) and DM medication(s) at time of discharge.  Thanks, Barnie Alderman, RN, MSN, CDE Diabetes Coordinator Inpatient Diabetes Program [phone] (Team Pager from 8am to 5pm)

## 2020-02-12 NOTE — Plan of Care (Signed)
  Problem: Education: Goal: Knowledge of General Education information will improve Description: Including pain rating scale, medication(s)/side effects and non-pharmacologic comfort measures Outcome: Progressing   Problem: Pain Managment: Goal: General experience of comfort will improve Outcome: Progressing   Problem: Safety: Goal: Ability to remain free from injury will improve Outcome: Progressing   Tylenol not helpful with headache pain. Tramadol given once. Patient reports decreased pain score from 8 to 3.

## 2020-03-15 LAB — ECHOCARDIOGRAM COMPLETE
AR max vel: 2.28 cm2
AV Area VTI: 2.53 cm2
AV Area mean vel: 2.2 cm2
AV Mean grad: 3.5 mmHg
AV Peak grad: 5.7 mmHg
Ao pk vel: 1.19 m/s
Area-P 1/2: 3.72 cm2
Height: 65 in
S' Lateral: 2.29 cm
Weight: 2800 oz

## 2020-07-20 ENCOUNTER — Encounter: Payer: Self-pay | Admitting: Obstetrics and Gynecology

## 2020-07-27 ENCOUNTER — Encounter: Payer: Self-pay | Admitting: Obstetrics and Gynecology

## 2020-10-08 ENCOUNTER — Emergency Department: Payer: BLUE CROSS/BLUE SHIELD

## 2020-10-08 ENCOUNTER — Other Ambulatory Visit: Payer: Self-pay

## 2020-10-08 ENCOUNTER — Emergency Department
Admission: EM | Admit: 2020-10-08 | Discharge: 2020-10-08 | Disposition: A | Discharge status: Home / Self Care | Payer: BLUE CROSS/BLUE SHIELD | Attending: Emergency Medicine | Admitting: Emergency Medicine

## 2020-10-08 DIAGNOSIS — I10 Essential (primary) hypertension: Secondary | ICD-10-CM | POA: Diagnosis not present

## 2020-10-08 DIAGNOSIS — Z7984 Long term (current) use of oral hypoglycemic drugs: Secondary | ICD-10-CM | POA: Insufficient documentation

## 2020-10-08 DIAGNOSIS — E1165 Type 2 diabetes mellitus with hyperglycemia: Secondary | ICD-10-CM | POA: Diagnosis not present

## 2020-10-08 DIAGNOSIS — R079 Chest pain, unspecified: Secondary | ICD-10-CM | POA: Diagnosis present

## 2020-10-08 DIAGNOSIS — R0789 Other chest pain: Secondary | ICD-10-CM | POA: Insufficient documentation

## 2020-10-08 DIAGNOSIS — Z8616 Personal history of COVID-19: Secondary | ICD-10-CM | POA: Insufficient documentation

## 2020-10-08 DIAGNOSIS — Z79899 Other long term (current) drug therapy: Secondary | ICD-10-CM | POA: Diagnosis not present

## 2020-10-08 LAB — CBC
HCT: 36.9 % (ref 36.0–46.0)
Hemoglobin: 12.9 g/dL (ref 12.0–15.0)
MCH: 30 pg (ref 26.0–34.0)
MCHC: 35 g/dL (ref 30.0–36.0)
MCV: 85.8 fL (ref 80.0–100.0)
Platelets: 242 10*3/uL (ref 150–400)
RBC: 4.3 MIL/uL (ref 3.87–5.11)
RDW: 12.5 % (ref 11.5–15.5)
WBC: 11.7 10*3/uL — ABNORMAL HIGH (ref 4.0–10.5)
nRBC: 0 % (ref 0.0–0.2)

## 2020-10-08 LAB — COMPREHENSIVE METABOLIC PANEL
ALT: 10 U/L (ref 0–44)
AST: 15 U/L (ref 15–41)
Albumin: 4.2 g/dL (ref 3.5–5.0)
Alkaline Phosphatase: 62 U/L (ref 38–126)
Anion gap: 9 (ref 5–15)
BUN: 9 mg/dL (ref 6–20)
CO2: 27 mmol/L (ref 22–32)
Calcium: 9.1 mg/dL (ref 8.9–10.3)
Chloride: 98 mmol/L (ref 98–111)
Creatinine, Ser: 0.8 mg/dL (ref 0.44–1.00)
GFR, Estimated: >60 mL/min (ref >60)
Glucose, Bld: 392 mg/dL — ABNORMAL HIGH (ref 70–99)
Potassium: 3.6 mmol/L (ref 3.5–5.1)
Sodium: 134 mmol/L — ABNORMAL LOW (ref 135–145)
Total Bilirubin: 0.6 mg/dL (ref 0.3–1.2)
Total Protein: 8.1 g/dL (ref 6.5–8.1)

## 2020-10-08 LAB — D-DIMER, QUANTITATIVE: D-Dimer, Quant: <0.27 ug/mL-FEU (ref 0.00–0.50)

## 2020-10-08 LAB — CBG MONITORING, ED: Glucose-Capillary: 284 mg/dL — ABNORMAL HIGH (ref 70–99)

## 2020-10-08 LAB — TROPONIN I (HIGH SENSITIVITY)
Troponin I (High Sensitivity): 4 ng/L (ref <18)
Troponin I (High Sensitivity): 5 ng/L (ref <18)

## 2020-10-08 LAB — HCG, QUANTITATIVE, PREGNANCY: hCG, Beta Chain, Quant, S: <1 m[IU]/mL (ref <5)

## 2020-10-08 LAB — LIPASE, BLOOD: Lipase: 32 U/L (ref 11–51)

## 2020-10-08 MED ORDER — IOHEXOL 350 MG/ML SOLN
75.0000 mL | Freq: Once | INTRAVENOUS | Status: AC | PRN
  Administered 2020-10-08: 75 mL via INTRAVENOUS

## 2020-10-08 MED ORDER — LACTATED RINGERS IV BOLUS
1000.0000 mL | Freq: Once | INTRAVENOUS | Status: AC
  Administered 2020-10-08: 1000 mL via INTRAVENOUS

## 2020-10-08 MED ORDER — FENTANYL CITRATE (PF) 100 MCG/2ML IJ SOLN
50.0000 ug | Freq: Once | INTRAMUSCULAR | Status: AC
  Administered 2020-10-08: 50 ug via INTRAVENOUS
  Filled 2020-10-08: qty 2

## 2020-10-08 MED ORDER — METFORMIN HCL 500 MG PO TABS: 500.0000 mg | ORAL_TABLET | Freq: Two times a day (BID) | ORAL | 0 refills | Status: AC

## 2020-10-08 MED ORDER — ASPIRIN 81 MG PO CHEW
324.0000 mg | CHEWABLE_TABLET | Freq: Once | ORAL | Status: AC
  Administered 2020-10-08: 324 mg via ORAL
  Filled 2020-10-08: qty 4

## 2020-10-08 NOTE — ED Notes (Signed)
In to check CBG at this time,pts fluids are still running. Put fluids on pump to ensure fluid delivery. Will reassess and obtain CBG once fluids finished.

## 2020-10-08 NOTE — ED Triage Notes (Signed)
Pt was at work tonight and was having mid-sternal CP. Pt states pain started yesterday and has become worse. Does not radiate. Pt complains of a HA

## 2020-10-08 NOTE — ED Notes (Signed)
Pt transported to CT ?

## 2020-10-08 NOTE — ED Provider Notes (Signed)
[facility] Emergency Department Provider Note  ____________________________________________  Time seen: Approximately 2:47 AM  I have reviewed the triage vital signs and the nursing notes.   HISTORY  Chief Complaint Chest Pain   HPI Ambree Naylea Webster is a 45 y.o. female with a history of diabetes, hypertension, SVT, hyperlipidemia, COVID-19 who presents for evaluation of chest pain.  Patient reports that she had pain yesterday but that resolved.  This evening she started having pain again.  She describes the pain is sharp and pulling located on the right side of her chest and radiating across her back.  She denies shortness of breath, dizziness, cough, fever.  The pain has been constant for a couple of hours.  She denies any trauma or heavy exertional work.  She denies any personal or family history of blood clots, recent travel immobilization, leg pain or swelling, hemoptysis, or exogenous hormones.  She reports that the pain is worse with palpation of her chest.   Past Medical History:  Diagnosis Date  . COVID-19   . Diabetes mellitus without complication ([place])   . Hypertension   . Migraine     Patient Active Problem List   Diagnosis Date Noted  . Obesity with body mass index of 30.0-39.9 02/12/2020  . Dyslipidemia 02/12/2020  . DM (diabetes mellitus), type 2 ([place]) 02/11/2020  . Syncope 02/11/2020  . HTN (hypertension) 02/11/2020  . Leukocytosis   . SVT (supraventricular tachycardia) ([place]) 02/10/2020    Past Surgical History:  Procedure Laterality Date  . CESAREAN SECTION      Prior to Admission medications   Medication Sig Start Date End Date Taking? Authorizing Provider  blood glucose meter kit and supplies Dispense based on patient and insurance preference. Use up to four times daily as directed. (FOR ICD-10 E10.9, E11.9). 02/12/20   Jennye Boroughs, MD  diltiazem (CARDIZEM CD) 120 MG 24 hr capsule Take 1 capsule (120 mg total) by mouth daily.  02/12/20   Avie Arenas, PA-C  losartan (COZAAR) 25 MG tablet Take 1 tablet (25 mg total) by mouth daily. 02/12/20   Avie Arenas, PA-C  metFORMIN (GLUCOPHAGE) 500 MG tablet Take 1 tablet (500 mg total) by mouth 2 (two) times daily with a meal. 10/08/20   Alfred Levins, [place], MD  glipiZIDE (GLUCOTROL) 5 MG tablet Take 1 tablet (5 mg total) by mouth daily before breakfast. 02/12/20 10/08/20  Jennye Boroughs, MD    Allergies Patient has no known allergies.  History reviewed. No pertinent family history.  Social History Social History   Tobacco Use  . Smoking status: Never Smoker  . Smokeless tobacco: Never Used  Vaping Use  . Vaping Use: Never used  Substance Use Topics  . Alcohol use: No  . Drug use: No    Review of Systems  Constitutional: Negative for fever. Eyes: Negative for visual changes. ENT: Negative for sore throat. Neck: No neck pain  Cardiovascular: + chest pain. Respiratory: Negative for shortness of breath. Gastrointestinal: Negative for abdominal pain, vomiting or diarrhea. Genitourinary: Negative for dysuria. Musculoskeletal: Negative for back pain. Skin: Negative for rash. Neurological: Negative for headaches, weakness or numbness. Psych: No SI or HI  ____________________________________________   PHYSICAL EXAM:  VITAL SIGNS: ED Triage Vitals  Enc Vitals Group     BP --      Pulse Rate 10/08/20 0212 95     Resp 10/08/20 0212 18     Temp 10/08/20 0212 98.8 F (37.1 C)  Temp src --      SpO2 10/08/20 0212 98 %     Weight 10/08/20 0211 193 lb 5.5 oz (87.7 kg)     Height 10/08/20 0211 5' 4"  (1.626 m)     Head Circumference --      Peak Flow --      Pain Score 10/08/20 0213 6     Pain Loc --      Pain Edu? --      Excl. in [place]? --     Constitutional: Alert and oriented. Well appearing and in no apparent distress. HEENT:      Head: Normocephalic and atraumatic.         Eyes: Conjunctivae are normal. Sclera is non-icteric.        Mouth/Throat: Mucous membranes are moist.       Neck: Supple with no signs of meningismus. Cardiovascular: Regular rate and rhythm. No murmurs, gallops, or rubs. 2+ symmetrical distal pulses are present in all extremities. No JVD.  Palpation of the chest produces mild pain. Respiratory: Normal respiratory effort. Lungs are clear to auscultation bilaterally.  Gastrointestinal: Soft, non tender, and non distended. Musculoskeletal:  No edema, cyanosis, or erythema of extremities. Neurologic: Normal speech and language. Face is symmetric. Moving all extremities. No gross focal neurologic deficits are appreciated. Skin: Skin is warm, dry and intact. No rash noted. Psychiatric: Mood and affect are normal. Speech and behavior are normal.  ____________________________________________   LABS (all labs ordered are listed, but only abnormal results are displayed)  Labs Reviewed  CBC - Abnormal; Notable for the following components:      Result Value   WBC 11.7 (*)    All other components within normal limits  COMPREHENSIVE METABOLIC PANEL - Abnormal; Notable for the following components:   Sodium 134 (*)    Glucose, Bld 392 (*)    All other components within normal limits  LIPASE, BLOOD  D-DIMER, QUANTITATIVE  HCG, QUANTITATIVE, PREGNANCY  CBG MONITORING, ED  TROPONIN I (HIGH SENSITIVITY)  TROPONIN I (HIGH SENSITIVITY)   ____________________________________________  EKG  ED ECG REPORT I, Rudene Re, the attending physician, personally viewed and interpreted this ECG.  Sinus rhythm, rate of 92, normal intervals, normal axis, no ST elevations or depressions.  Normal EKG. ____________________________________________  RADIOLOGY  I have personally reviewed the images performed during this visit and I agree with the Radiologist's read.   Interpretation by Radiologist:  DG Chest Portable 1 View  Result Date: 10/08/2020 CLINICAL DATA:  Chest pain EXAM: PORTABLE CHEST 1 VIEW  COMPARISON:  02/10/2020 FINDINGS: The heart size and mediastinal contours are within normal limits. Both lungs are clear. The visualized skeletal structures are unremarkable. IMPRESSION: No active disease. Electronically Signed   By: Fidela Salisbury MD   On: 10/08/2020 02:49   CT ANGIO CHEST AORTA W/CM & OR WO/CM  Result Date: 10/08/2020 CLINICAL DATA:  45 year old female with midsternal chest pain, progressive. EXAM: CT ANGIOGRAPHY CHEST WITH CONTRAST TECHNIQUE: Multidetector CT imaging of the chest was performed using the standard protocol during bolus administration of intravenous contrast. Multiplanar CT image reconstructions and MIPs were obtained to evaluate the vascular anatomy. Pre contrast multi detector chest images also provided. CONTRAST:  67m OMNIPAQUE IOHEXOL 350 MG/ML SOLN COMPARISON:  Portable chest 0225 hours today.  CTA chest 02/10/2020. FINDINGS: Cardiovascular: Study designed for aortic evaluation. Good intravascular contrast bolus. Stable and normal thoracic aorta compared to last year. Three vessel arch configuration. There is coronary artery calcified atherosclerosis evident on  series 10, image 30. Stable cardiac size, within normal limits. No pericardial effusion. Central pulmonary arteries are also opacified and appear to be patent. Mediastinum/Nodes: Stable and negative. No mediastinal lymphadenopathy. Lungs/Pleura: Major airways are patent. Stable lung volumes compared to last year. Lungs appear stable and clear. No pleural effusion. Upper Abdomen: Negative visible liver, spleen, pancreas, adrenal glands, kidneys and bowel. Musculoskeletal: Negative. Review of the MIP images confirms the above findings. IMPRESSION: Negative CTA chest aside from calcified coronary artery atherosclerosis. Electronically Signed   By: Genevie Ann M.D.   On: 10/08/2020 04:50      ____________________________________________   PROCEDURES  Procedure(s) performed:yes .1-3 Lead EKG  Interpretation Performed by: Rudene Re, MD Authorized by: Rudene Re, MD     Interpretation: non-specific     ECG rate assessment: normal     Rhythm: sinus rhythm     Ectopy: none     Conduction: normal     Critical Care performed: yes  CRITICAL CARE Performed by: Rudene Re  ?  Total critical care time: 35 min  Critical care time was exclusive of separately billable procedures and treating other patients.  Critical care was necessary to treat or prevent imminent or life-threatening deterioration.  Critical care was time spent personally by me on the following activities: development of treatment plan with patient and/or surrogate as well as nursing, discussions with consultants, evaluation of patient's response to treatment, examination of patient, obtaining history from patient or surrogate, ordering and performing treatments and interventions, ordering and review of laboratory studies, ordering and review of radiographic studies, pulse oximetry and re-evaluation of patient's condition.  ____________________________________________   INITIAL IMPRESSION / ASSESSMENT AND PLAN / ED COURSE   45 y.o. female with a history of diabetes, hypertension, SVT, hyperlipidemia, COVID-19 who presents for evaluation of sharp/ pulling R sided chest pain for several hours.  Patient is well-appearing in no distress, normal vital signs, strong pulses in all 4 extremities, no asymmetric leg swelling or pitting edema, palpation of the chest induces mild discomfort, lungs are clear to auscultation, heart regular rate and rhythm with no murmurs.  EKG shows no signs of ischemia or dysrhythmias.  Ddx MSK vs costochondritis versus pleurisy versus PE versus ACS versus pneumonia versus pneumothorax versus dissection.  Patient placed on telemetry for close monitoring.  Old medical records reviewed.  Labs and imaging pending.  _________________________ 3:50 AM on  10/08/2020 ----------------------------------------- Chest x-ray visualized by me with no acute findings, confirmed by radiology.  First troponin is negative.  Chemistry panel showing hyperglycemia no evidence of DKA.  D-dimer negative.  Patient complaining of worsening pain in spite of aspirin therefore a CT angio was ordered to rule out dissection  _________________________ 5:05 AM on 10/08/2020 -----------------------------------------  Second troponin is pending.  CTA negative for dissection, PE, or any other acute findings, visualized by me and confirmed by radiology.  At this point if second troponin is negative pain is most likely consistent with MSK.     _________________________ 6:40 AM on 10/08/2020 -----------------------------------------  Repeat troponin is negative.  Patient reports full resolution of her pain.  Her sugar is elevated but trended down after IV fluids.  Patient reports that she was taken off of all of her diabetes medications.  Therefore we will restart patient on metformin.  Recommended close follow-up with PCP.  Discussed my standard return precautions for new chest pain or shortness of breath.    _____________________________________________ Please note:  Patient was evaluated in Emergency Department today for the  symptoms described in the history of present illness. Patient was evaluated in the context of the global COVID-19 pandemic, which necessitated consideration that the patient might be at risk for infection with the SARS-CoV-2 virus that causes COVID-19. Institutional protocols and algorithms that pertain to the evaluation of patients at risk for COVID-19 are in a state of rapid change based on information released by regulatory bodies including the CDC and federal and state organizations. These policies and algorithms were followed during the patient's care in the ED.  Some ED evaluations and interventions may be delayed as a result of limited staffing  during the pandemic.   [place] Controlled Substance Database was reviewed by me. ____________________________________________   FINAL CLINICAL IMPRESSION(S) / ED DIAGNOSES   Final diagnoses:  Atypical chest pain  Type 2 diabetes mellitus with hyperglycemia, unspecified whether long term insulin use ([place])      NEW MEDICATIONS STARTED DURING THIS VISIT:  ED Discharge Orders         Ordered    metFORMIN (GLUCOPHAGE) 500 MG tablet  2 times daily with meals        10/08/20 7680           Note:  This document was prepared using Dragon voice recognition software and may include unintentional dictation errors.    Alfred Levins, [place], MD 10/08/20 [phone]

## 2020-10-08 NOTE — Discharge Instructions (Signed)
Your CT, EKG, and blood work did not identify a cause for your pain.  Therefore follow-up with your primary care doctor in 2 days for further evaluation.  Return to the emergency room for new or worsening chest pain, abdominal pain, fever, shortness of breath.

## 2021-03-04 ENCOUNTER — Emergency Department
Admission: EM | Admit: 2021-03-04 | Discharge: 2021-03-04 | Disposition: A | Discharge status: Home / Self Care | Payer: No Typology Code available for payment source | Attending: Emergency Medicine | Admitting: Emergency Medicine

## 2021-03-04 ENCOUNTER — Emergency Department: Payer: No Typology Code available for payment source

## 2021-03-04 ENCOUNTER — Other Ambulatory Visit: Payer: Self-pay

## 2021-03-04 DIAGNOSIS — S8992XA Unspecified injury of left lower leg, initial encounter: Secondary | ICD-10-CM | POA: Diagnosis present

## 2021-03-04 DIAGNOSIS — Z79899 Other long term (current) drug therapy: Secondary | ICD-10-CM | POA: Diagnosis not present

## 2021-03-04 DIAGNOSIS — E119 Type 2 diabetes mellitus without complications: Secondary | ICD-10-CM | POA: Diagnosis not present

## 2021-03-04 DIAGNOSIS — W01198A Fall on same level from slipping, tripping and stumbling with subsequent striking against other object, initial encounter: Secondary | ICD-10-CM | POA: Insufficient documentation

## 2021-03-04 DIAGNOSIS — Z7984 Long term (current) use of oral hypoglycemic drugs: Secondary | ICD-10-CM | POA: Diagnosis not present

## 2021-03-04 DIAGNOSIS — S0990XA Unspecified injury of head, initial encounter: Secondary | ICD-10-CM

## 2021-03-04 DIAGNOSIS — S0083XA Contusion of other part of head, initial encounter: Secondary | ICD-10-CM | POA: Insufficient documentation

## 2021-03-04 DIAGNOSIS — Y990 Civilian activity done for income or pay: Secondary | ICD-10-CM | POA: Diagnosis not present

## 2021-03-04 DIAGNOSIS — I10 Essential (primary) hypertension: Secondary | ICD-10-CM | POA: Insufficient documentation

## 2021-03-04 DIAGNOSIS — S86912A Strain of unspecified muscle(s) and tendon(s) at lower leg level, left leg, initial encounter: Secondary | ICD-10-CM | POA: Diagnosis not present

## 2021-03-04 DIAGNOSIS — Z8616 Personal history of COVID-19: Secondary | ICD-10-CM | POA: Insufficient documentation

## 2021-03-04 DIAGNOSIS — S0093XA Contusion of unspecified part of head, initial encounter: Secondary | ICD-10-CM

## 2021-03-04 DIAGNOSIS — T148XXA Other injury of unspecified body region, initial encounter: Secondary | ICD-10-CM

## 2021-03-04 DIAGNOSIS — M79605 Pain in left leg: Secondary | ICD-10-CM | POA: Diagnosis not present

## 2021-03-04 LAB — CBC WITH DIFFERENTIAL/PLATELET
Abs Immature Granulocytes: 0.05 K/uL (ref 0.00–0.07)
Basophils Absolute: 0 K/uL (ref 0.0–0.1)
Basophils Relative: 0 %
Eosinophils Absolute: 0.1 K/uL (ref 0.0–0.5)
Eosinophils Relative: 0 %
HCT: 36 % (ref 36.0–46.0)
Hemoglobin: 12.7 g/dL (ref 12.0–15.0)
Immature Granulocytes: 0 %
Lymphocytes Relative: 20 %
Lymphs Abs: 2.2 K/uL (ref 0.7–4.0)
MCH: 29.7 pg (ref 26.0–34.0)
MCHC: 35.3 g/dL (ref 30.0–36.0)
MCV: 84.3 fL (ref 80.0–100.0)
Monocytes Absolute: 0.8 K/uL (ref 0.1–1.0)
Monocytes Relative: 7 %
Neutro Abs: 8.2 K/uL — ABNORMAL HIGH (ref 1.7–7.7)
Neutrophils Relative %: 73 %
Platelets: 216 K/uL (ref 150–400)
RBC: 4.27 MIL/uL (ref 3.87–5.11)
RDW: 12.2 % (ref 11.5–15.5)
WBC: 11.3 K/uL — ABNORMAL HIGH (ref 4.0–10.5)
nRBC: 0 % (ref 0.0–0.2)

## 2021-03-04 LAB — CBG MONITORING, ED: Glucose-Capillary: 424 mg/dL — ABNORMAL HIGH (ref 70–99)

## 2021-03-04 LAB — BASIC METABOLIC PANEL
Anion gap: 9 (ref 5–15)
BUN: 10 mg/dL (ref 6–20)
CO2: 24 mmol/L (ref 22–32)
Calcium: 8.6 mg/dL — ABNORMAL LOW (ref 8.9–10.3)
Chloride: 96 mmol/L — ABNORMAL LOW (ref 98–111)
Creatinine, Ser: 0.62 mg/dL (ref 0.44–1.00)
GFR, Estimated: >60 mL/min (ref >60)
Glucose, Bld: 419 mg/dL — ABNORMAL HIGH (ref 70–99)
Potassium: 4 mmol/L (ref 3.5–5.1)
Sodium: 129 mmol/L — ABNORMAL LOW (ref 135–145)

## 2021-03-04 MED ORDER — KETOROLAC TROMETHAMINE 30 MG/ML IJ SOLN
15.0000 mg | Freq: Once | INTRAMUSCULAR | Status: AC
  Administered 2021-03-04: 15 mg via INTRAVENOUS
  Filled 2021-03-04: qty 1

## 2021-03-04 MED ORDER — OXYCODONE-ACETAMINOPHEN 5-325 MG PO TABS: 2.0000 | ORAL_TABLET | Freq: Four times a day (QID) | ORAL | 0 refills | Status: DC | PRN

## 2021-03-04 MED ORDER — ONDANSETRON HCL 4 MG/2ML IJ SOLN
4.0000 mg | INTRAMUSCULAR | Status: AC
  Administered 2021-03-04: 4 mg via INTRAVENOUS
  Filled 2021-03-04: qty 2

## 2021-03-04 MED ORDER — MORPHINE SULFATE (PF) 4 MG/ML IV SOLN
4.0000 mg | Freq: Once | INTRAVENOUS | Status: AC
  Administered 2021-03-04: 4 mg via INTRAVENOUS
  Filled 2021-03-04: qty 1

## 2021-03-04 MED ORDER — LACTATED RINGERS IV BOLUS
1000.0000 mL | Freq: Once | INTRAVENOUS | Status: AC
  Administered 2021-03-04: 1000 mL via INTRAVENOUS

## 2021-03-04 MED ORDER — DOCUSATE SODIUM 100 MG PO CAPS: ORAL_CAPSULE | ORAL | 0 refills | Status: AC

## 2021-03-04 MED ORDER — OXYCODONE-ACETAMINOPHEN 5-325 MG PO TABS
2.0000 | ORAL_TABLET | Freq: Once | ORAL | Status: AC
Start: 2021-03-04 — End: 2021-03-04
  Administered 2021-03-04: 2 via ORAL
  Filled 2021-03-04: qty 2

## 2021-03-04 NOTE — ED Notes (Signed)
Pt up to bathroom and back to bed via wheelchair with minimal assistance

## 2021-03-04 NOTE — ED Notes (Signed)
Ace wrap applied to left knee; pt states she has used crutches in the past and understands how to use them; pt discharged via wheelchair

## 2021-03-04 NOTE — ED Notes (Signed)
Pt reports a 5 minute loc when the fall occured

## 2021-03-04 NOTE — ED Triage Notes (Signed)
Pt was at work and was trying to assist a pt to the bathroom and pt fell backwards striking her head on the end of a table; pt has c-collar on; pt c/o neck pain up top; pt has cbg of 514; pt given NS 500 ml bolus

## 2021-03-04 NOTE — Discharge Instructions (Signed)
You have been seen in the Emergency Department (ED) today for a fall.  Your work up does not show any emergent injury such as fractures or dislocations.  Please take over-the-counter ibuprofen and/or Tylenol as needed for your pain (unless you have an allergy or your doctor as told you not to take them), or take any prescribed medication as instructed.  Take Percocet as prescribed for severe pain. Do not drink alcohol, drive or participate in any other potentially dangerous activities while taking this medication as it may make you sleepy. Do not take this medication with any other sedating medications, either prescription or over-the-counter. If you were prescribed Percocet or Vicodin, do not take these with acetaminophen (Tylenol) as it is already contained within these medications.   This medication is an opiate (or narcotic) pain medication and can be habit forming.  Use it as little as possible to achieve adequate pain control.  Do not use or use it with extreme caution if you have a history of opiate abuse or dependence.  If you are on a pain contract with your primary care doctor or a pain specialist, be sure to let them know you were prescribed this medication today from the [facility] Emergency Department.  This medication is intended for your use only - do not give any to anyone else and keep it in a secure place where nobody else, especially children, have access to it.  It will also cause or worsen constipation, so you may want to consider taking an over-the-counter stool softener while you are taking this medication.  For your knee, we recommend you use an Ace wrap and crutches as needed.  You may bear weight as tolerated and return to early mobility if possible so that you do not become more stiff and sore.  We provided the name and number of an orthopedic surgeon with whom you can follow-up for reassessment to determine if any additional evaluation is needed.  Please follow up with your  doctor regarding today's Emergency Department (ED) visit and your recent fall.    Return to the ED if you have any headache, confusion, slurred speech, weakness/numbness of any arm or leg, or any increased pain.

## 2021-03-04 NOTE — ED Provider Notes (Signed)
[facility] Emergency Department Provider Note  ____________________________________________   Event Date/Time   First MD Initiated Contact with Patient 03/04/21 [phone]     (approximate)  I have reviewed the triage vital signs and the nursing notes.   HISTORY  Chief Complaint Head Injury    HPI Helen Webster is a 45 y.o. female   with a prior history of what she reports as gestational diabetes as well as migraine and hypertension.  She presents tonight by EMS for evaluation after a fall at work.  She reports that she works at [employer] and she was assisting a patient to the bathroom when the patient fell towards her.  This knocked her backwards and the patient struck the back of her head on the nightstand.  She believes that she lost consciousness.  She also feels like her knee was twisted around behind her when she woke up.  She has severe pain in the back of her head and the upper part of her neck as well as in her left knee.  She denies chest pain, shortness of breath, nausea, vomiting.  The pain is sharp and severe and moving around makes it worse, nothing in particular makes it better.  No numbness nor tingling, no weakness in her extremities, just the pain in the left leg.        Past Medical History:  Diagnosis Date   COVID-19    Diabetes mellitus without complication ([place])    Hypertension    Migraine     Patient Active Problem List   Diagnosis Date Noted   Obesity with body mass index of 30.0-39.9 02/12/2020   Dyslipidemia 02/12/2020   DM (diabetes mellitus), type 2 ([place]) 02/11/2020   Syncope 02/11/2020   HTN (hypertension) 02/11/2020   Leukocytosis    SVT (supraventricular tachycardia) ([place]) 02/10/2020    Past Surgical History:  Procedure Laterality Date   CESAREAN SECTION      Prior to Admission medications   Medication Sig Start Date End Date Taking? Authorizing Provider  docusate sodium (COLACE) 100 MG capsule Take 1 tablet once  or twice daily as needed for constipation while taking narcotic pain medicine 03/04/21  Yes Hinda Kehr, MD  oxyCODONE-acetaminophen (PERCOCET) 5-325 MG tablet Take 2 tablets by mouth every 6 (six) hours as needed for severe pain. 03/04/21  Yes Hinda Kehr, MD  blood glucose meter kit and supplies Dispense based on patient and insurance preference. Use up to four times daily as directed. (FOR ICD-10 E10.9, E11.9). 02/12/20   Jennye Boroughs, MD  diltiazem (CARDIZEM CD) 120 MG 24 hr capsule Take 1 capsule (120 mg total) by mouth daily. 02/12/20   Avie Arenas, PA-C  losartan (COZAAR) 25 MG tablet Take 1 tablet (25 mg total) by mouth daily. 02/12/20   Avie Arenas, PA-C  metFORMIN (GLUCOPHAGE) 500 MG tablet Take 1 tablet (500 mg total) by mouth 2 (two) times daily with a meal. 10/08/20   Alfred Levins, [place], MD  glipiZIDE (GLUCOTROL) 5 MG tablet Take 1 tablet (5 mg total) by mouth daily before breakfast. 02/12/20 10/08/20  Jennye Boroughs, MD    Allergies Patient has no known allergies.  No family history on file.  Social History Social History   Tobacco Use   Smoking status: Never   Smokeless tobacco: Never  Vaping Use   Vaping Use: Never used  Substance Use Topics   Alcohol use: No   Drug use: No    Review of Systems Constitutional:  No fever/chills Eyes: No visual changes. ENT: No sore throat. Cardiovascular: Denies chest pain. Respiratory: Denies shortness of breath. Gastrointestinal: No abdominal pain.  No nausea, no vomiting.  No diarrhea.  No constipation. Genitourinary: Negative for dysuria. Musculoskeletal: Pain in the back of her head and upper part of her neck as well as the left knee. Integumentary: Negative for rash. Neurological: Negative for headaches, focal weakness or numbness.   ____________________________________________   PHYSICAL EXAM:  VITAL SIGNS: ED Triage Vitals  Enc Vitals Group     BP 03/04/21 0320 (!) 175/106     Pulse Rate 03/04/21 0320 95      Resp 03/04/21 0320 18     Temp 03/04/21 0320 98 F (36.7 C)     Temp Source 03/04/21 0320 Oral     SpO2 03/04/21 0320 99 %     Weight 03/04/21 0319 77.1 kg (170 lb)     Height 03/04/21 0319 1.626 m (5' 4" )     Head Circumference --      Peak Flow --      Pain Score 03/04/21 0318 10     Pain Loc --      Pain Edu? --      Excl. in [place]? --     Constitutional: Alert and oriented.  Eyes: Conjunctivae are normal.  Head: Atraumatic. Nose: No congestion/rhinnorhea. Mouth/Throat: Patient is wearing a mask. Neck: No stridor.  No meningeal signs.   Cardiovascular: Normal rate, regular rhythm. Good peripheral circulation. Respiratory: Normal respiratory effort.  No retractions. Gastrointestinal: Soft and nontender. No distention.  Musculoskeletal: No visible deformities to left knee including no effusions nor ecchymoses.  Patient reports tenderness to palpation just below the knee but again there is no swelling.  She has mild pain with flexion and extension of the knee which is reduced with distraction.  She has a c-collar in place upon arrival.  Once the c-collar was removed, the patient was clinically cleared with no tenderness to palpation of the cervical spine, some musculoskeletal tenderness to the paraspinal muscles most notably on the right side of her upper neck. Neurologic:  Normal speech and language. No gross focal neurologic deficits are appreciated.  Skin:  Skin is warm, dry and intact.   ____________________________________________   LABS (all labs ordered are listed, but only abnormal results are displayed)  Labs Reviewed  BASIC METABOLIC PANEL - Abnormal; Notable for the following components:      Result Value   Sodium 129 (*)    Chloride 96 (*)    Glucose, Bld 419 (*)    Calcium 8.6 (*)    All other components within normal limits  CBC WITH DIFFERENTIAL/PLATELET - Abnormal; Notable for the following components:   WBC 11.3 (*)    Neutro Abs 8.2 (*)    All other  components within normal limits  CBG MONITORING, ED - Abnormal; Notable for the following components:   Glucose-Capillary 424 (*)    All other components within normal limits   ____________________________________________   RADIOLOGY I, Hinda Kehr, personally viewed and evaluated these images (plain radiographs) as part of my medical decision making, as well as reviewing the written report by the radiologist.  ED MD interpretation: No acute abnormalities identified on head CT nor cervical spine CT.  She has no indication of acute abnormality on x-ray of her left knee.  Official radiology report(s): CT HEAD WO CONTRAST (5MM)  Result Date: 03/04/2021 CLINICAL DATA:  Head trauma, moderate to severe. Patient fell backwards striking  the head. EXAM: CT HEAD WITHOUT CONTRAST TECHNIQUE: Contiguous axial images were obtained from the base of the skull through the vertex without intravenous contrast. COMPARISON:  02/09/2017 FINDINGS: Brain: No evidence of acute infarction, hemorrhage, hydrocephalus, extra-axial collection or mass lesion/mass effect. Vascular: No hyperdense vessel or unexpected calcification. Skull: Calvarium appears intact. Sinuses/Orbits: Retention cyst in the left maxillary antrum. Paranasal sinuses and mastoid air cells are otherwise clear. Other: None. IMPRESSION: No acute intracranial abnormalities. Electronically Signed   By: Lucienne Capers M.D.   On: 03/04/2021 03:48   CT Cervical Spine Wo Contrast  Result Date: 03/04/2021 CLINICAL DATA:  Neck trauma, midline tenderness.  Fell backwards. EXAM: CT CERVICAL SPINE WITHOUT CONTRAST TECHNIQUE: Multidetector CT imaging of the cervical spine was performed without intravenous contrast. Multiplanar CT image reconstructions were also generated. COMPARISON:  None. FINDINGS: Alignment: There is reversal of the usual cervical lordosis without anterior subluxation. Normal alignment of the posterior elements. This is likely positional but  could represent muscle spasm. C1-2 articulation appears intact. Skull base and vertebrae: No acute fracture. No primary bone lesion or focal pathologic process. Soft tissues and spinal canal: No prevertebral fluid or swelling. No visible canal hematoma. Disc levels:  Intervertebral disc space heights are normal. Upper chest: Lung apices are clear. Other: None. IMPRESSION: Nonspecific reversal of the usual cervical lordosis. No acute displaced fractures identified. Electronically Signed   By: Lucienne Capers M.D.   On: 03/04/2021 03:52   DG Knee Complete 4 Views Left  Result Date: 03/04/2021 CLINICAL DATA:  Pain after a fall EXAM: LEFT KNEE - COMPLETE 4+ VIEW COMPARISON:  06/18/2019 FINDINGS: No evidence of fracture, dislocation, or joint effusion. No evidence of arthropathy or other focal bone abnormality. Soft tissues are unremarkable. IMPRESSION: Negative. Electronically Signed   By: Lucienne Capers M.D.   On: 03/04/2021 03:49    ____________________________________________   PROCEDURES   Procedure(s) performed (including Critical Care):  Procedures   ____________________________________________   INITIAL IMPRESSION / MDM / [place] / ED COURSE  As part of my medical decision making, I reviewed the following data within the [place] History obtained from family, Nursing notes reviewed and incorporated, Labs reviewed , Old chart reviewed, Radiograph reviewed , Notes from prior ED visits, and [place] Controlled Substance Database   Differential diagnosis includes, but is not limited to, intracranial hemorrhage, skull fracture, cervical spine injury, knee fracture, knee or patellar dislocation.  Of note, the patient's CBG was greater than 500 for EMS so I also obtained labs.  It is possible she may have diabetes and I will rule out DKA.  Morphine 4 mg IV and Zofran 4 mg IV.  I will maintain C-spine precautions until scans are complete.  I ordered head CT and  cervical spine CT as well as x-rays of the knee.  I have a low suspicion for emergent injury but will verify and rule out.  Patient understands and agrees with the plan.       Clinical Course as of 03/04/21 1324  Fri Mar 04, 2021  0402 No acute abnormalities identified on cervical spine CT, head CT, nor x-rays of the left knee. [CF]  0451 I removed the patient's c-collar and clinically cleared her.  She continues to complain of pain but the pain is more at the base of the right side of her skull rather than her neck and she has generalized muscle pain but no focal tenderness along the cervical spine.  She has no neurological  deficits.  I updated her regarding the reassuring scans and the normal x-rays.  We will provide an Ace wrap and crutches as well as a short course of pain medicine and recommendations for outpatient follow-up.  I had my usual and customary musculoskeletal pain/sprain discussion.  We also discussed her lab work.  She has mild hyponatremia which is likely not clinically relevant, but of note her glucose is 419.  I strongly encouraged her to follow-up with her primary care doctor to discuss the possibility of diabetes since she had a history of prior gestational diabetes but likely is suffering from a degree of type 2 diabetes at this point.  She and her husband understand and agree with the plan.  I provided 2 Percocet in addition to the previously prescribed analgesia and I gave my usual and customary return precautions. [CF]  [phone] Also provided Toradol 15 mg IV. [CF]    Clinical Course User Index [CF] Hinda Kehr, MD     ____________________________________________  FINAL CLINICAL IMPRESSION(S) / ED DIAGNOSES  Final diagnoses:  Minor head injury, initial encounter  Contusion of head, unspecified part of head, initial encounter  Left knee injury, initial encounter  Musculoskeletal strain     MEDICATIONS GIVEN DURING THIS VISIT:  Medications   oxyCODONE-acetaminophen (PERCOCET/ROXICET) 5-325 MG per tablet 2 tablet (has no administration in time range)  ketorolac (TORADOL) 30 MG/ML injection 15 mg (has no administration in time range)  morphine 4 MG/ML injection 4 mg (4 mg Intravenous Given 03/04/21 0359)  ondansetron (ZOFRAN) injection 4 mg (4 mg Intravenous Given 03/04/21 0400)  lactated ringers bolus 1,000 mL (1,000 mLs Intravenous New Bag/Given 03/04/21 0359)     ED Discharge Orders          Ordered    oxyCODONE-acetaminophen (PERCOCET) 5-325 MG tablet  Every 6 hours PRN        03/04/21 0512    docusate sodium (COLACE) 100 MG capsule        03/04/21 6286             Note:  This document was prepared using Dragon voice recognition software and may include unintentional dictation errors.   Hinda Kehr, MD 03/04/21 [phone]

## 2021-03-12 ENCOUNTER — Encounter: Payer: Self-pay | Admitting: Emergency Medicine

## 2021-03-12 ENCOUNTER — Emergency Department: Payer: No Typology Code available for payment source

## 2021-03-12 ENCOUNTER — Other Ambulatory Visit: Payer: Self-pay

## 2021-03-12 ENCOUNTER — Emergency Department
Admission: EM | Admit: 2021-03-12 | Discharge: 2021-03-12 | Disposition: A | Discharge status: Home / Self Care | Payer: No Typology Code available for payment source | Attending: Emergency Medicine | Admitting: Emergency Medicine

## 2021-03-12 DIAGNOSIS — Z8616 Personal history of COVID-19: Secondary | ICD-10-CM | POA: Insufficient documentation

## 2021-03-12 DIAGNOSIS — Y92410 Unspecified street and highway as the place of occurrence of the external cause: Secondary | ICD-10-CM | POA: Insufficient documentation

## 2021-03-12 DIAGNOSIS — M545 Low back pain, unspecified: Secondary | ICD-10-CM | POA: Diagnosis present

## 2021-03-12 DIAGNOSIS — Z7984 Long term (current) use of oral hypoglycemic drugs: Secondary | ICD-10-CM | POA: Insufficient documentation

## 2021-03-12 DIAGNOSIS — E119 Type 2 diabetes mellitus without complications: Secondary | ICD-10-CM | POA: Diagnosis not present

## 2021-03-12 DIAGNOSIS — Z79899 Other long term (current) drug therapy: Secondary | ICD-10-CM | POA: Diagnosis not present

## 2021-03-12 DIAGNOSIS — I10 Essential (primary) hypertension: Secondary | ICD-10-CM | POA: Diagnosis not present

## 2021-03-12 MED ORDER — ACETAMINOPHEN 325 MG PO TABS
650.0000 mg | ORAL_TABLET | Freq: Once | ORAL | Status: AC
  Administered 2021-03-12: 650 mg via ORAL
  Filled 2021-03-12: qty 2

## 2021-03-12 MED ORDER — MELOXICAM 15 MG PO TABS: 15.0000 mg | ORAL_TABLET | Freq: Every day | ORAL | 2 refills | Status: DC

## 2021-03-12 MED ORDER — METHOCARBAMOL 500 MG PO TABS: 500.0000 mg | ORAL_TABLET | Freq: Three times a day (TID) | ORAL | 0 refills | Status: AC | PRN
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
## 2021-03-12 NOTE — Discharge Instructions (Signed)
You can take meloxicam and Robaxin as directed for low back pain and upper back pain.
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
## 2021-06-10 ENCOUNTER — Emergency Department
Admission: EM | Admit: 2021-06-10 | Discharge: 2021-06-10 | Disposition: A | Discharge status: Home / Self Care | Payer: BLUE CROSS/BLUE SHIELD | Attending: Emergency Medicine | Admitting: Emergency Medicine

## 2021-06-10 ENCOUNTER — Other Ambulatory Visit: Payer: Self-pay

## 2021-06-10 ENCOUNTER — Encounter: Payer: Self-pay | Admitting: Intensive Care

## 2021-06-10 DIAGNOSIS — E119 Type 2 diabetes mellitus without complications: Secondary | ICD-10-CM | POA: Insufficient documentation

## 2021-06-10 DIAGNOSIS — Z79899 Other long term (current) drug therapy: Secondary | ICD-10-CM | POA: Diagnosis not present

## 2021-06-10 DIAGNOSIS — Z7984 Long term (current) use of oral hypoglycemic drugs: Secondary | ICD-10-CM | POA: Insufficient documentation

## 2021-06-10 DIAGNOSIS — M25511 Pain in right shoulder: Secondary | ICD-10-CM | POA: Diagnosis present

## 2021-06-10 DIAGNOSIS — I10 Essential (primary) hypertension: Secondary | ICD-10-CM | POA: Diagnosis not present

## 2021-06-10 DIAGNOSIS — M719 Bursopathy, unspecified: Secondary | ICD-10-CM

## 2021-06-10 DIAGNOSIS — M7551 Bursitis of right shoulder: Secondary | ICD-10-CM | POA: Diagnosis not present

## 2021-06-10 HISTORY — DX: Bursopathy, unspecified: M71.9

## 2021-06-10 MED ORDER — METHYLPREDNISOLONE 4 MG PO TBPK: ORAL_TABLET | ORAL | 0 refills | Status: DC

## 2021-06-10 MED ORDER — BACLOFEN 10 MG PO TABS: 10.0000 mg | ORAL_TABLET | Freq: Three times a day (TID) | ORAL | 0 refills | Status: AC

## 2021-06-10 MED ORDER — KETOROLAC TROMETHAMINE 30 MG/ML IJ SOLN
30.0000 mg | Freq: Once | INTRAMUSCULAR | Status: AC
  Administered 2021-06-10: 10:00:00 30 mg via INTRAMUSCULAR
  Filled 2021-06-10: qty 1

## 2021-06-10 MED ORDER — CYCLOBENZAPRINE HCL 10 MG PO TABS
10.0000 mg | ORAL_TABLET | Freq: Once | ORAL | Status: AC
  Administered 2021-06-10: 10:00:00 10 mg via ORAL
  Filled 2021-06-10: qty 1

## 2021-06-10 MED ORDER — TRAMADOL HCL 50 MG PO TABS: 50.0000 mg | ORAL_TABLET | Freq: Four times a day (QID) | ORAL | 0 refills | Status: DC | PRN

## 2021-06-10 NOTE — ED Triage Notes (Signed)
Patient c/o right shoulder pain that started X2 days ago. NAD noted. Reports it is hard to lift shoulder. HX bursitis

## 2021-06-10 NOTE — ED Provider Notes (Signed)
[facility] Emergency Department Provider Note  ____________________________________________   Event Date/Time   First MD Initiated Contact with Patient 06/10/21 [phone]     (approximate)  I have reviewed the triage vital signs and the nursing notes.   HISTORY  Chief Complaint Shoulder Pain    HPI Helen Webster is a 45 y.o. female presents emergency department complaining of right shoulder pain.  Patient is having difficulty with movement.  States history of bursitis about 5 years ago.  Patient also has diabetes.  States that she is having difficulty with movement.  The area is very sore and spasm.  No numbness or tingling.  No known injury.  No fever or chill  Past Medical History:  Diagnosis Date   Bursitis    COVID-19    Diabetes mellitus without complication ([place])    Hypertension    Migraine     Patient Active Problem List   Diagnosis Date Noted   Obesity with body mass index of 30.0-39.9 02/12/2020   Dyslipidemia 02/12/2020   DM (diabetes mellitus), type 2 ([place]) 02/11/2020   Syncope 02/11/2020   HTN (hypertension) 02/11/2020   Leukocytosis    SVT (supraventricular tachycardia) ([place]) 02/10/2020    Past Surgical History:  Procedure Laterality Date   CESAREAN SECTION      Prior to Admission medications   Medication Sig Start Date End Date Taking? Authorizing Provider  baclofen (LIORESAL) 10 MG tablet Take 1 tablet (10 mg total) by mouth 3 (three) times daily for 7 days. 06/10/21 06/17/21 Yes Jazlyn Tippens, Linden Dolin, PA-C  methylPREDNISolone (MEDROL DOSEPAK) 4 MG TBPK tablet Take 6 pills on day one then decrease by 1 pill each day 06/10/21  Yes Tacuma Graffam, Linden Dolin, PA-C  traMADol (ULTRAM) 50 MG tablet Take 1 tablet (50 mg total) by mouth every 6 (six) hours as needed. 06/10/21  Yes Johntavius Shepard, Linden Dolin, PA-C  blood glucose meter kit and supplies Dispense based on patient and insurance preference. Use up to four times daily as directed. (FOR ICD-10 E10.9,  E11.9). 02/12/20   Jennye Boroughs, MD  diltiazem (CARDIZEM CD) 120 MG 24 hr capsule Take 1 capsule (120 mg total) by mouth daily. 02/12/20   Avie Arenas, PA-C  docusate sodium (COLACE) 100 MG capsule Take 1 tablet once or twice daily as needed for constipation while taking narcotic pain medicine 03/04/21   Hinda Kehr, MD  losartan (COZAAR) 25 MG tablet Take 1 tablet (25 mg total) by mouth daily. 02/12/20   Avie Arenas, PA-C  metFORMIN (GLUCOPHAGE) 500 MG tablet Take 1 tablet (500 mg total) by mouth 2 (two) times daily with a meal. 10/08/20   Alfred Levins, [place], MD  glipiZIDE (GLUCOTROL) 5 MG tablet Take 1 tablet (5 mg total) by mouth daily before breakfast. 02/12/20 10/08/20  Jennye Boroughs, MD    Allergies Patient has no known allergies.  History reviewed. No pertinent family history.  Social History Social History   Tobacco Use   Smoking status: Never   Smokeless tobacco: Never  Vaping Use   Vaping Use: Never used  Substance Use Topics   Alcohol use: Yes   Drug use: No    Review of Systems  Constitutional: No fever/chills Eyes: No visual changes. ENT: No sore throat. Respiratory: Denies cough Cardiovascular: Denies chest pain Gastrointestinal: Denies abdominal pain Genitourinary: Negative for dysuria. Musculoskeletal: Negative for back pain. Skin: Negative for rash. Psychiatric: no mood changes,     ____________________________________________   PHYSICAL EXAM:  VITAL SIGNS:  ED Triage Vitals  Enc Vitals Group     BP 06/10/21 0711 (!) 169/102     Pulse Rate 06/10/21 0711 81     Resp 06/10/21 0711 16     Temp 06/10/21 0711 98.6 F (37 C)     Temp Source 06/10/21 0711 Oral     SpO2 06/10/21 0711 100 %     Weight 06/10/21 0712 180 lb (81.6 kg)     Height 06/10/21 0712 5' 5"  (1.651 m)     Head Circumference --      Peak Flow --      Pain Score 06/10/21 0719 10     Pain Loc --      Pain Edu? --      Excl. in [place]? --     Constitutional: Alert and  oriented. Well appearing and in no acute distress. Eyes: Conjunctivae are normal.  Head: Atraumatic. Nose: No congestion/rhinnorhea. Mouth/Throat: Mucous membranes are moist.   Neck:  supple no lymphadenopathy noted Cardiovascular: Normal rate, regular rhythm.  Respiratory: Normal respiratory effort.  No retractions, l GU: deferred Musculoskeletal: Decreased range of motion of the right shoulder, tender along the bursa of the scapula, trapezius muscle and scapular levator, joint of the right shoulder is also tender, grips are equal bilaterally, neurovascular intact  neurologic:  Normal speech and language.  Skin:  Skin is warm, dry and intact. No rash noted. Psychiatric: Mood and affect are normal. Speech and behavior are normal.  ____________________________________________   LABS (all labs ordered are listed, but only abnormal results are displayed)  Labs Reviewed - No data to display ____________________________________________   ____________________________________________  RADIOLOGY    ____________________________________________   PROCEDURES  Procedure(s) performed: sling  Procedures    ____________________________________________   INITIAL IMPRESSION / ASSESSMENT AND PLAN / ED COURSE  Pertinent labs & imaging results that were available during my care of the patient were reviewed by me and considered in my medical decision making (see chart for details).   Patient's 45 year old female presents emergency department with right shoulder pain.  See HPI.  Physical exam shows patient per stable.  She does have decreased range of motion of the right shoulder secondary discomfort, joint spaces and bursa's are tender to palpation.  With the patient's past medical history of bursitis and diabetes I do feel that this is more of a bursitis/soft tissue disorder.  We placed in a sling for comfort.  She is apply ice.  She is given Toradol and Flexeril here in the ED.  She be  given a prescription for Medrol Dosepak and baclofen along with pain medication.  She is to follow-up with orthopedics if not improving in 3 to 4 days.  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  Patient is in agreement with treatment plan.  She was discharged in stable condition.     Helen Webster was evaluated in Emergency Department on 06/10/2021 for the symptoms described in the history of present illness. She was evaluated in the context of the global COVID-19 pandemic, which necessitated consideration that the patient might be at risk for infection with the SARS-CoV-2 virus that causes COVID-19. Institutional protocols and algorithms that pertain to the evaluation of patients at risk for COVID-19 are in a state of rapid change based on information released by regulatory bodies including the CDC and federal and state organizations. These policies and algorithms were followed during the patient's  care in the ED.    As part of my medical decision making, I reviewed the following data within the [facility] notes reviewed and incorporated, Old chart reviewed, Notes from prior ED visits, and [place] Controlled Substance Database  ____________________________________________   FINAL CLINICAL IMPRESSION(S) / ED DIAGNOSES  Final diagnoses:  Acute pain of right shoulder  Acute bursitis      NEW MEDICATIONS STARTED DURING THIS VISIT:  New Prescriptions   BACLOFEN (LIORESAL) 10 MG TABLET    Take 1 tablet (10 mg total) by mouth 3 (three) times daily for 7 days.   METHYLPREDNISOLONE (MEDROL DOSEPAK) 4 MG TBPK TABLET    Take 6 pills on day one then decrease by 1 pill each day   TRAMADOL (ULTRAM) 50 MG TABLET    Take 1 tablet (50 mg total) by mouth every 6 (six) hours as needed.     Note:  This document was prepared using Dragon voice recognition software and may include unintentional dictation errors.    Versie Starks,  PA-C 06/10/21 8069    Harvest Dark, MD 06/10/21 [phone]

## 2021-06-10 NOTE — Discharge Instructions (Signed)
Follow-up with [facility] orthopedics if not improving in 1 week.  Return emergency department if worsening.  Wear the sling for 3 to 4 days to decrease inflammation in your shoulder.  Also apply ice to decrease inflammation.  Do not use heat. Take your medication as prescribed.  Tramadol for pain only if the Medrol Dosepak and baclofen do not control the pain.  You could also take Tylenol.

## 2021-06-10 NOTE — ED Notes (Signed)
See triage note  presents with pain to right shoulder  denies any injury  hx of burisitis increased pain with movement

## 2021-07-01 ENCOUNTER — Emergency Department: Payer: BLUE CROSS/BLUE SHIELD

## 2021-07-01 ENCOUNTER — Other Ambulatory Visit: Payer: Self-pay

## 2021-07-01 ENCOUNTER — Emergency Department
Admission: EM | Admit: 2021-07-01 | Discharge: 2021-07-01 | Disposition: A | Discharge status: Home / Self Care | Payer: BLUE CROSS/BLUE SHIELD | Attending: Emergency Medicine | Admitting: Emergency Medicine

## 2021-07-01 DIAGNOSIS — I10 Essential (primary) hypertension: Secondary | ICD-10-CM | POA: Insufficient documentation

## 2021-07-01 DIAGNOSIS — R519 Headache, unspecified: Secondary | ICD-10-CM | POA: Insufficient documentation

## 2021-07-01 DIAGNOSIS — R55 Syncope and collapse: Secondary | ICD-10-CM | POA: Diagnosis present

## 2021-07-01 DIAGNOSIS — R42 Dizziness and giddiness: Secondary | ICD-10-CM | POA: Insufficient documentation

## 2021-07-01 DIAGNOSIS — E119 Type 2 diabetes mellitus without complications: Secondary | ICD-10-CM | POA: Diagnosis not present

## 2021-07-01 LAB — CBC
HCT: 37.9 % (ref 36.0–46.0)
Hemoglobin: 12.8 g/dL (ref 12.0–15.0)
MCH: 28.8 pg (ref 26.0–34.0)
MCHC: 33.8 g/dL (ref 30.0–36.0)
MCV: 85.2 fL (ref 80.0–100.0)
Platelets: 254 10*3/uL (ref 150–400)
RBC: 4.45 MIL/uL (ref 3.87–5.11)
RDW: 12.3 % (ref 11.5–15.5)
WBC: 11.7 10*3/uL — ABNORMAL HIGH (ref 4.0–10.5)
nRBC: 0 % (ref 0.0–0.2)

## 2021-07-01 LAB — BASIC METABOLIC PANEL
Anion gap: 6 (ref 5–15)
BUN: 10 mg/dL (ref 6–20)
CO2: 31 mmol/L (ref 22–32)
Calcium: 9 mg/dL (ref 8.9–10.3)
Chloride: 97 mmol/L — ABNORMAL LOW (ref 98–111)
Creatinine, Ser: 0.72 mg/dL (ref 0.44–1.00)
GFR, Estimated: >60 mL/min (ref >60)
Glucose, Bld: 369 mg/dL — ABNORMAL HIGH (ref 70–99)
Potassium: 4 mmol/L (ref 3.5–5.1)
Sodium: 134 mmol/L — ABNORMAL LOW (ref 135–145)

## 2021-07-01 MED ORDER — BUTALBITAL-APAP-CAFFEINE 50-325-40 MG PO TABS
2.0000 | ORAL_TABLET | Freq: Once | ORAL | Status: AC
  Administered 2021-07-01: 2 via ORAL
  Filled 2021-07-01: qty 2

## 2021-07-01 MED ORDER — KETOROLAC TROMETHAMINE 30 MG/ML IJ SOLN
30.0000 mg | Freq: Once | INTRAMUSCULAR | Status: AC
Start: 2021-07-01 — End: 2021-07-01
  Administered 2021-07-01: 30 mg via INTRAMUSCULAR
  Filled 2021-07-01: qty 1

## 2021-07-01 NOTE — Discharge Instructions (Addendum)
Please take Tylenol and ibuprofen/Advil for your pain.  It is safe to take them together, or to alternate them every few hours.  Take up to 1000mg of Tylenol at a time, up to 4 times per day.  Do not take more than 4000 mg of Tylenol in 24 hours.  For ibuprofen, take 400-600 mg, 4-5 times per day. ° ° °

## 2021-07-01 NOTE — ED Notes (Signed)
Pt given menstrual pad and disposable underwear due to her being on her menstruation at this time. Pt appreciative.

## 2021-07-01 NOTE — ED Notes (Signed)
Patient transported to CT 

## 2021-07-01 NOTE — ED Provider Notes (Signed)
[facility] Provider Note    Event Date/Time   First MD Initiated Contact with Patient 07/01/21 [phone]     (approximate)   History   Loss of Consciousness   HPI  Helen Webster is a 46 y.o. female who presents to the ED for evaluation of Loss of Consciousness   I reviewed PCP visit from September.  History of HTN, DM and cesarean section x2. Obesity.   Patient presents to the ED, accompanied by her sister, for evaluation of a syncopal episode that occurred at her workplace.  She works at a Scientist, clinical (histocompatibility and immunogenetics) at [employer], and her sister is a Lawyer at that same SNF.  Patient reports having a typical migrainous headache for much of the night, and has not taken any medications for it yet.  She reports getting up from her workstation to walk down the hall, developing presyncopal lightheaded dizziness, and then "unknown happened."  She reportedly had a syncopal episode in the hallway.  Since then, she reports no additional syncopal episodes, dizziness or events.  Reports continuous migrainous headache that has been present all night during her shift.  Denies any chest pain, shortness of breath, back pain or abdominal pain.  Denies nausea or emesis.  Reports tolerating p.o. intake and toileting at her baseline.  Physical Exam   Triage Vital Signs: ED Triage Vitals  Enc Vitals Group     BP 07/01/21 0614 (!) 176/104     Pulse Rate 07/01/21 0614 82     Resp 07/01/21 0614 18     Temp 07/01/21 0614 99.1 F (37.3 C)     Temp Source 07/01/21 0614 Oral     SpO2 07/01/21 0614 97 %     Weight 07/01/21 0615 180 lb (81.6 kg)     Height 07/01/21 0615 5\' 5"  (1.651 m)     Head Circumference --      Peak Flow --      Pain Score 07/01/21 0615 10     Pain Loc --      Pain Edu? --      Excl. in [place]? --     Most recent vital signs: Vitals:   07/01/21 0841 07/01/21 0930  BP: (!) 165/96 (!) 179/92  Pulse: 76 72  Resp: 16 16  Temp:    SpO2: 99% 99%    General: Awake, no  distress.  Pleasant and conversational in full sentences.  Making jokes. CV:  Good peripheral perfusion.  Resp:  Normal effort.  Abd:  No distention.  Nontender MSK:  No deformity noted.  Neuro:  No focal deficits appreciated. Cranial nerves II through XII intact 5/5 strength and sensation in all 4 extremities Other:     ED Results / Procedures / Treatments   Labs (all labs ordered are listed, but only abnormal results are displayed) Labs Reviewed  BASIC METABOLIC PANEL - Abnormal; Notable for the following components:      Result Value   Sodium 134 (*)    Chloride 97 (*)    Glucose, Bld 369 (*)    All other components within normal limits  CBC - Abnormal; Notable for the following components:   WBC 11.7 (*)    All other components within normal limits  URINALYSIS, ROUTINE W REFLEX MICROSCOPIC  POC URINE PREG, ED    EKG Sinus rhythm, rate of 82 bpm.  Normal axis and intervals.  No evidence of acute ischemia.  RADIOLOGY CT head reviewed by me without  evidence of acute intracranial pathology.  Official radiology report(s): CT HEAD WO CONTRAST ( )  Result Date: 07/01/2021 CLINICAL DATA:  Syncope, headache EXAM: CT HEAD WITHOUT CONTRAST TECHNIQUE: Contiguous axial images were obtained from the base of the skull through the vertex without intravenous contrast. RADIATION DOSE REDUCTION: This exam was performed according to the departmental dose-optimization program which includes automated exposure control, adjustment of the mA and/or kV according to patient size and/or use of iterative reconstruction technique. COMPARISON:  September 2022 FINDINGS: Brain: There is no acute intracranial hemorrhage, mass effect, or edema. Gray-white differentiation is preserved. There is no extra-axial fluid collection. Ventricles and sulci are within normal limits in size and configuration. Vascular: No hyperdense vessel or unexpected calcification. Skull: Calvarium is unremarkable. Sinuses/Orbits:  No acute finding. Other: None. IMPRESSION: No acute intracranial abnormality. Electronically Signed   By: Guadlupe Spanish M.D.   On: 07/01/2021 08:46    PROCEDURES and INTERVENTIONS:  .1-3 Lead EKG Interpretation Performed by: Delton Prairie, MD Authorized by: Delton Prairie, MD     Interpretation: normal     ECG rate:  74   ECG rate assessment: normal     Rhythm: sinus rhythm     Ectopy: none     Conduction: normal    Medications  butalbital-acetaminophen-caffeine (FIORICET) 50-325-40 MG per tablet 2 tablet (2 tablets Oral Given 07/01/21 0845)  ketorolac (TORADOL) 30 MG/ML injection 30 mg (30 mg Intramuscular Given 07/01/21 0902)     IMPRESSION / MDM / ASSESSMENT AND PLAN / ED COURSE  I reviewed the triage vital signs and the nursing notes.  46 year old female presents to the ED after a syncopal episode that I suspect is due to vasovagal event.  She looks clinically well to me without neurologic or vascular deficits.  No signs of trauma or injuries from the fall.  She reports a typical migrainous headache for her, for which she will receive Fioricet and NSAIDs.  EKG is reassuring without interval changes to suggest cardiac etiology of her syncope.  Blood work is benign with minimal leukocytosis and hyponatremia, as well as hyperglycemia without acidosis or signs of DKA or HHS. Patient with improving symptoms after Fioricet and Toradol.  Anticipate her dizziness and syncopal episodes are related to a vasovagal syndrome considering her rapid development of dizziness upon standing and reassuring examination now.  Less likely seizure or stroke considering reassuring work-up.  Will discharge with close return precautions.  Clinical Course as of 07/01/21 0958  Caleen Essex Jul 01, 2021  0957 Reassessed.  Headache improved.  Feeling better. [DS]    Clinical Course User Index [DS] Delton Prairie, MD     FINAL CLINICAL IMPRESSION(S) / ED DIAGNOSES   Final diagnoses:  Syncope and collapse  Vasovagal  episode     Rx / DC Orders   ED Discharge Orders     None        Note:  This document was prepared using Dragon voice recognition software and may include unintentional dictation errors.   Delton Prairie, MD 07/01/21 [phone]

## 2021-07-01 NOTE — ED Triage Notes (Signed)
EMS brings pt in from work for c/o dizziness (BP 190/100) syncopal episode at work, mid upper CP

## 2021-07-01 NOTE — ED Triage Notes (Signed)
Pt presents to ER via ems from work after a syncopal episode.  Pt states she was dizzy tonight and was talking to a co-worker and passed out.  Pt does not remember fall, or remember hitting her had.  Pt does c/o severe HA at this time.  Pt otherwise A&O x4.

## 2021-08-15 ENCOUNTER — Emergency Department: Payer: BLUE CROSS/BLUE SHIELD

## 2021-08-15 ENCOUNTER — Emergency Department
Admission: EM | Admit: 2021-08-15 | Discharge: 2021-08-15 | Disposition: A | Discharge status: Home / Self Care | Payer: BLUE CROSS/BLUE SHIELD | Attending: Emergency Medicine | Admitting: Emergency Medicine

## 2021-08-15 ENCOUNTER — Other Ambulatory Visit: Payer: Self-pay

## 2021-08-15 ENCOUNTER — Encounter: Payer: Self-pay | Admitting: Emergency Medicine

## 2021-08-15 DIAGNOSIS — I10 Essential (primary) hypertension: Secondary | ICD-10-CM | POA: Insufficient documentation

## 2021-08-15 DIAGNOSIS — D72829 Elevated white blood cell count, unspecified: Secondary | ICD-10-CM | POA: Insufficient documentation

## 2021-08-15 DIAGNOSIS — R0789 Other chest pain: Secondary | ICD-10-CM | POA: Diagnosis present

## 2021-08-15 LAB — BASIC METABOLIC PANEL
Anion gap: 9 (ref 5–15)
BUN: 10 mg/dL (ref 6–20)
CO2: 28 mmol/L (ref 22–32)
Calcium: 9.3 mg/dL (ref 8.9–10.3)
Chloride: 96 mmol/L — ABNORMAL LOW (ref 98–111)
Creatinine, Ser: 0.63 mg/dL (ref 0.44–1.00)
GFR, Estimated: >60 mL/min (ref >60)
Glucose, Bld: 285 mg/dL — ABNORMAL HIGH (ref 70–99)
Potassium: 3.9 mmol/L (ref 3.5–5.1)
Sodium: 133 mmol/L — ABNORMAL LOW (ref 135–145)

## 2021-08-15 LAB — TROPONIN I (HIGH SENSITIVITY)
Troponin I (High Sensitivity): 4 ng/L (ref <18)
Troponin I (High Sensitivity): 4 ng/L (ref <18)

## 2021-08-15 LAB — CBC
HCT: 38.9 % (ref 36.0–46.0)
Hemoglobin: 12.9 g/dL (ref 12.0–15.0)
MCH: 28.7 pg (ref 26.0–34.0)
MCHC: 33.2 g/dL (ref 30.0–36.0)
MCV: 86.4 fL (ref 80.0–100.0)
Platelets: 285 10*3/uL (ref 150–400)
RBC: 4.5 MIL/uL (ref 3.87–5.11)
RDW: 12.4 % (ref 11.5–15.5)
WBC: 11.7 10*3/uL — ABNORMAL HIGH (ref 4.0–10.5)
nRBC: 0 % (ref 0.0–0.2)

## 2021-08-15 MED ORDER — LORAZEPAM 1 MG PO TABS
1.0000 mg | ORAL_TABLET | Freq: Once | ORAL | Status: AC
Start: 2021-08-15 — End: 2021-08-15
  Administered 2021-08-15: 1 mg via ORAL
  Filled 2021-08-15: qty 1

## 2021-08-15 MED ORDER — LORAZEPAM 1 MG PO TABS: 1.0000 mg | ORAL_TABLET | Freq: Two times a day (BID) | ORAL | 0 refills | Status: AC | PRN

## 2021-08-15 NOTE — Discharge Instructions (Signed)
As we discussed, I would recommend taking the Ativan.  Try taking this at night to help with rest.  You can take it during the day, but should not drive or operate any heavy machinery when taking it.  You can take up to 1 mg twice a day. ? ?Return to the emergency department as needed. ? ?Take blood pressure medications as discussed with your primary doctor. ?

## 2021-08-15 NOTE — ED Triage Notes (Signed)
Patient to ED via POV from PCP for chest pain. Pt states pain has been intermittent since the weekend but worse today. Patient Aox4 at this time. ?

## 2021-08-15 NOTE — ED Provider Notes (Signed)
? ?[facility] ?Provider Note ? ? ? Event Date/Time  ? First MD Initiated Contact with Patient 08/15/21 2209   ?  (approximate) ? ? ?History  ? ?Chest Pain ? ? ?HPI ? ?Helen Webster is a 46 y.o. female here with chest pain.  The patient has reportedly had intermittent headache, chest pressure, ongoing for the last several weeks.  She has had increasing blood pressures at home.  She has been under significant stress taking care of her mother with Lewy body dementia.  She saw her PCP today and was told to come in for this, as she was significantly hypertensive.  She has had fairly regular, daily headaches.  No thunderclap onset.  No focal numbness or weakness.  Her chest pain was a mild pressure which is now improving.  This seems to correlate with when her blood pressure is elevated.  She had her blood pressure medications increased at her PCP today.  She does admit that much of this correlates with increased stress and she has not been sleeping well. ?  ? ? ?Physical Exam  ? ?Triage Vital Signs: ?ED Triage Vitals  ?Enc Vitals Group  ?   BP 08/15/21 1813 (!) 146/94  ?   Pulse Rate 08/15/21 1813 82  ?   Resp 08/15/21 1813 17  ?   Temp 08/15/21 1813 98.2 ?F (36.8 ?C)  ?   Temp Source 08/15/21 1813 Oral  ?   SpO2 08/15/21 1813 99 %  ?   Weight 08/15/21 1817 180 lb (81.6 kg)  ?   Height --   ?   Head Circumference --   ?   Peak Flow --   ?   Pain Score 08/15/21 1816 8  ?   Pain Loc --   ?   Pain Edu? --   ?   Excl. in [place]? --   ? ? ?Most recent vital signs: ?Vitals:  ? 08/15/21 1813 08/15/21 2033  ?BP: (!) 146/94 (!) 152/97  ?Pulse: 82 84  ?Resp: 17 20  ?Temp: 98.2 ?F (36.8 ?C)   ?SpO2: 99% 98%  ? ? ? ?General: Awake, no distress.  ?CV:  Good peripheral perfusion.  Regular rate and rhythm.  No murmurs or rubs. ?Resp:  Normal effort.  Normal work of breathing.  Lungs clear to auscultation bilaterally ?Abd:  No distention.  No tenderness. ?Other:  Cranial nerves II through XII intact.  Strength out of  5 bilateral upper and lower extremities.  Normal sensation light touch.  Normal gait.  No focal neurological deficits. ? ? ?ED Results / Procedures / Treatments  ? ?Labs ?(all labs ordered are listed, but only abnormal results are displayed) ?Labs Reviewed  ?BASIC METABOLIC PANEL - Abnormal; Notable for the following components:  ?    Result Value  ? Sodium 133 (*)   ? Chloride 96 (*)   ? Glucose, Bld 285 (*)   ? All other components within normal limits  ?CBC - Abnormal; Notable for the following components:  ? WBC 11.7 (*)   ? All other components within normal limits  ?POC URINE PREG, ED  ?TROPONIN I (HIGH SENSITIVITY)  ?TROPONIN I (HIGH SENSITIVITY)  ? ? ? ?EKG ?Normal sinus rhythm, ventricular rate 74.  PR 158, QRS 84, QTc 432.  No acute ST elevations or depressions. ? ? ?RADIOLOGY ?Chest x-ray: No acute cardiopulmonary disease ? ? ?I also independently reviewed and agree wit radiologist interpretations. ? ? ?PROCEDURES: ? ?Critical Care performed:  No ? ? ? ?MEDICATIONS ORDERED IN ED: ?Medications  ?LORazepam (ATIVAN) tablet 1 mg (1 mg Oral Given 08/15/21 2253)  ? ? ? ?IMPRESSION / MDM / ASSESSMENT AND PLAN / ED COURSE  ?I reviewed the triage vital signs and the nursing notes. ?             ?               ? ? ?The patient is on the cardiac monitor to evaluate for evidence of arrhythmia and/or significant heart rate changes. ? ?MDM:  ?46 yo F here with transient headache, chest pain in setting of hypertension. Suspect symptomatic hypertension, which seems to be exacerbated by multiple recent stressors. Re: her headache, this is a recurrent issue. No thunderclap onset, no focal neuro deficits, do not suspect SAH. No signs of meningitis or encephalitis. Re: her HTN, her PCP has increased her lisinopril-hCTZ which Is reasonable. Labs reassuring. EKG nonischemic and trop is negative. Renal function is normal. CBC with minimal likely stress-related leukocytosis. CXR clear.  ? ?Suspect pt's recent worsening of sx is  related to stressors, in addition to her underlying HTN. Given absence of red flags and improvement in her HA and BP, do not feel neuroimaging indicated at this time. Will have her increase lisinopril as discussed with her PCP, btu will also trial a short course of ativan to help with her sleep, as this seems to be a major contributor to her stress, and likely HTN.  ? ? ?MEDICATIONS GIVEN IN ED: ?Medications  ?LORazepam (ATIVAN) tablet 1 mg (1 mg Oral Given 08/15/21 2253)  ? ? ? ?Consults:  ?None ? ? ? ? ? ? ?FINAL CLINICAL IMPRESSION(S) / ED DIAGNOSES  ? ?Final diagnoses:  ?Primary hypertension  ? ? ? ?Rx / DC Orders  ? ?ED Discharge Orders   ? ?      Ordered  ?  LORazepam (ATIVAN) 1 MG tablet  2 times daily PRN       ? 08/15/21 2248  ? ?  ?  ? ?  ? ? ? ?Note:  This document was prepared using Dragon voice recognition software and may include unintentional dictation errors. ?  ?Shaune Pollack, MD ?08/16/21 0116 ? ?

## 2021-12-28 ENCOUNTER — Other Ambulatory Visit: Payer: Self-pay

## 2021-12-28 DIAGNOSIS — G43009 Migraine without aura, not intractable, without status migrainosus: Secondary | ICD-10-CM | POA: Diagnosis not present

## 2021-12-28 DIAGNOSIS — E119 Type 2 diabetes mellitus without complications: Secondary | ICD-10-CM | POA: Diagnosis not present

## 2021-12-28 DIAGNOSIS — I10 Essential (primary) hypertension: Secondary | ICD-10-CM | POA: Insufficient documentation

## 2021-12-28 DIAGNOSIS — Z7984 Long term (current) use of oral hypoglycemic drugs: Secondary | ICD-10-CM | POA: Insufficient documentation

## 2021-12-28 DIAGNOSIS — Z79899 Other long term (current) drug therapy: Secondary | ICD-10-CM | POA: Diagnosis not present

## 2021-12-28 DIAGNOSIS — T59811A Toxic effect of smoke, accidental (unintentional), initial encounter: Secondary | ICD-10-CM | POA: Diagnosis present

## 2021-12-28 DIAGNOSIS — Z8616 Personal history of COVID-19: Secondary | ICD-10-CM | POA: Insufficient documentation
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
## 2021-12-29 ENCOUNTER — Emergency Department
Admission: EM | Admit: 2021-12-29 | Discharge: 2021-12-29 | Disposition: A | Discharge status: Home / Self Care | Payer: BLUE CROSS/BLUE SHIELD | Attending: Emergency Medicine | Admitting: Emergency Medicine

## 2021-12-29 DIAGNOSIS — R238 Other skin changes: Secondary | ICD-10-CM

## 2021-12-29 DIAGNOSIS — G43009 Migraine without aura, not intractable, without status migrainosus: Secondary | ICD-10-CM

## 2021-12-29 MED ORDER — DIPHENHYDRAMINE HCL 50 MG/ML IJ SOLN
50.0000 mg | Freq: Once | INTRAMUSCULAR | Status: AC
  Administered 2021-12-29: 50 mg via INTRAVENOUS
  Filled 2021-12-29: qty 1

## 2021-12-29 MED ORDER — DEXAMETHASONE SODIUM PHOSPHATE 10 MG/ML IJ SOLN
10.0000 mg | Freq: Once | INTRAMUSCULAR | Status: AC
Start: 2021-12-29 — End: 2021-12-29
  Administered 2021-12-29: 10 mg via INTRAVENOUS
  Filled 2021-12-29: qty 1

## 2021-12-29 MED ORDER — SODIUM CHLORIDE 0.9 % IV BOLUS (SEPSIS)
1000.0000 mL | Freq: Once | INTRAVENOUS | Status: AC
  Administered 2021-12-29: 1000 mL via INTRAVENOUS

## 2021-12-29 MED ORDER — METOCLOPRAMIDE HCL 5 MG/ML IJ SOLN
10.0000 mg | Freq: Once | INTRAMUSCULAR | Status: AC
  Administered 2021-12-29: 10 mg via INTRAVENOUS
  Filled 2021-12-29: qty 2

## 2021-12-29 MED ORDER — KETOROLAC TROMETHAMINE 30 MG/ML IJ SOLN
30.0000 mg | Freq: Once | INTRAMUSCULAR | Status: AC
  Administered 2021-12-29: 30 mg via INTRAVENOUS
  Filled 2021-12-29: qty 1

## 2021-12-29 NOTE — Discharge Instructions (Signed)
You may use Benadryl 50 mg every 8 hours as needed over-the-counter for itching, skin irritation.
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
## 2021-12-29 NOTE — ED Notes (Signed)
E-signature pad unavailable - Pt verbalized understanding of D/C information - no additional concerns at this time.  

## 2022-06-09 ENCOUNTER — Emergency Department
Admission: EM | Admit: 2022-06-09 | Discharge: 2022-06-09 | Disposition: A | Discharge status: Home / Self Care | Payer: BLUE CROSS/BLUE SHIELD | Attending: Emergency Medicine | Admitting: Emergency Medicine

## 2022-06-09 ENCOUNTER — Other Ambulatory Visit: Payer: Self-pay

## 2022-06-09 ENCOUNTER — Encounter: Payer: Self-pay | Admitting: Emergency Medicine

## 2022-06-09 DIAGNOSIS — I10 Essential (primary) hypertension: Secondary | ICD-10-CM | POA: Insufficient documentation

## 2022-06-09 DIAGNOSIS — H6691 Otitis media, unspecified, right ear: Secondary | ICD-10-CM | POA: Insufficient documentation

## 2022-06-09 DIAGNOSIS — E119 Type 2 diabetes mellitus without complications: Secondary | ICD-10-CM | POA: Insufficient documentation

## 2022-06-09 DIAGNOSIS — H6690 Otitis media, unspecified, unspecified ear: Secondary | ICD-10-CM

## 2022-06-09 DIAGNOSIS — H9209 Otalgia, unspecified ear: Secondary | ICD-10-CM | POA: Diagnosis present

## 2022-06-09 MED ORDER — IBUPROFEN 600 MG PO TABS
600.0000 mg | ORAL_TABLET | Freq: Once | ORAL | Status: DC
  Filled 2022-06-09: qty 1

## 2022-06-09 MED ORDER — ACETAMINOPHEN 325 MG PO TABS
650.0000 mg | ORAL_TABLET | Freq: Once | ORAL | Status: AC
  Administered 2022-06-09: 650 mg via ORAL
  Filled 2022-06-09: qty 2

## 2022-06-09 MED ORDER — CEFDINIR 300 MG PO CAPS: 300.0000 mg | ORAL_CAPSULE | Freq: Two times a day (BID) | ORAL | 0 refills | Status: AC

## 2022-06-09 MED ORDER — OXYCODONE HCL 5 MG PO TABS
5.0000 mg | ORAL_TABLET | Freq: Three times a day (TID) | ORAL | 0 refills | Status: AC | PRN
Start: 2022-06-09 — End: ?

## 2022-06-09 MED ORDER — OXYCODONE HCL 5 MG PO TABS
5.0000 mg | ORAL_TABLET | Freq: Once | ORAL | Status: AC
  Administered 2022-06-09: 5 mg via ORAL
  Filled 2022-06-09: qty 1

## 2022-06-09 NOTE — Discharge Instructions (Addendum)
Take this new antibiotic in conjunction with the eardrops.  It is very important that you follow-up with ENT, call them today to make an appointment.  Please return for any new, worsening, or change in symptoms or other concerns.

## 2022-06-09 NOTE — ED Triage Notes (Signed)
Pt in with co right earache, was at [facility] ED last week and was dx with ear infection. Is currently on antibiotics. Pt here for worsening pain.

## 2022-06-09 NOTE — ED Provider Notes (Signed)
[facility] Provider Note    Event Date/Time   First MD Initiated Contact with Patient 06/09/22 [phone]     (approximate)   History   Otalgia   HPI  Helen Webster is a 46 y.o. female with a past medical history of hyperlipidemia, diabetes who presents today for evaluation of ear pain.  Patient reports that this has been ongoing for the past 5 days.  She went to ED in [facility] who started her on Augmentin and Ciprodex drops 3 days ago.  She does not feel like her ear has improved at all.  She denies any change in her hearing.  She reports that her pain is in front of her ear, no pain behind her ear.  No fevers.  No nausea or vomiting.  Patient Active Problem List   Diagnosis Date Noted   Obesity with body mass index of 30.0-39.9 02/12/2020   Dyslipidemia 02/12/2020   DM (diabetes mellitus), type 2 (HCC) 02/11/2020   Syncope 02/11/2020   HTN (hypertension) 02/11/2020   Leukocytosis    SVT (supraventricular tachycardia) 02/10/2020          Physical Exam   Triage Vital Signs: ED Triage Vitals [06/09/22 0631]  Enc Vitals Group     BP (!) 159/106     Pulse Rate 88     Resp 18     Temp 99.2 F (37.3 C)     Temp Source Oral     SpO2 100 %     Weight 175 lb (79.4 kg)     Height 5\' 5"  (1.651 m)     Head Circumference      Peak Flow      Pain Score 10     Pain Loc      Pain Edu?      Excl. in [place]?     Most recent vital signs: Vitals:   06/09/22 0631  BP: (!) 159/106  Pulse: 88  Resp: 18  Temp: 99.2 F (37.3 C)  SpO2: 100%    Physical Exam Vitals and nursing note reviewed.  Constitutional:      General: Awake and alert. No acute distress.    Appearance: Normal appearance. The patient is normal weight.  HENT:     Head: Normocephalic and atraumatic.     Mouth: Mucous membranes are moist.  Left ear: Clear canal, normal tympanic membrane.  No mastoid tenderness or erythema.  No proptosis of the pinna Right ear: Purulent effusion noted  with loss of light reflex.  Canal is clear, no purulence within the canal, no otorrhea.  No mastoid tenderness or erythema. No proptosis of pinna. Mild tenderness anterior to ear without swelling or erythema Eyes:     General: PERRL. Normal EOMs        Right eye: No discharge.        Left eye: No discharge.     Conjunctiva/sclera: Conjunctivae normal.  Cardiovascular:     Rate and Rhythm: Normal rate and regular rhythm.     Pulses: Normal pulses.  Pulmonary:     Effort: Pulmonary effort is normal. No respiratory distress.     Breath sounds: Normal breath sounds.  Abdominal:     Abdomen is soft. There is no abdominal tenderness. No rebound or guarding. No distention. Musculoskeletal:        General: No swelling. Normal range of motion.     Cervical back: Normal range of motion and neck supple.  Skin:    General:  Skin is warm and dry.     Capillary Refill: Capillary refill takes less than 2 seconds.     Findings: No rash.  Neurological:     Mental Status: The patient is awake and alert.      ED Results / Procedures / Treatments   Labs (all labs ordered are listed, but only abnormal results are displayed) Labs Reviewed - No data to display   EKG     RADIOLOGY     PROCEDURES:  Critical Care performed:   Procedures   MEDICATIONS ORDERED IN ED: Medications  ibuprofen (ADVIL) tablet 600 mg (600 mg Oral Not Given 06/09/22 0915)  acetaminophen (TYLENOL) tablet 650 mg (650 mg Oral Given 06/09/22 0914)  oxyCODONE (Oxy IR/ROXICODONE) immediate release tablet 5 mg (5 mg Oral Given 06/09/22 0913)     IMPRESSION / MDM / ASSESSMENT AND PLAN / ED COURSE  I reviewed the triage vital signs and the nursing notes.   Differential diagnosis includes, but is not limited to,   I reviewed the patient's chart.  Patient was seen in the ER in 12-26 with the same complaint was discharged on Ciprodex and Augmentin.  Patient has purulent effusion, though clear canal, no otorrhea.  No  swelling of the canal, do not suspect malignant otitis externa.  She has no mastoid tenderness or erythema, no proptosis of the pinna, no fever to suggest mastoiditis.  I discussed the case with Dr. Cyril Loosen given that she is already taking antibiotics, and he recommend switching the antibiotic to cefdinir and following up with ENT.  The appropriate information was provided.  Discussed with patient these recommendations and she agrees to call ENT today.  She requested pain medicine, reports that her pain is not improved with Tylenol at home.  She was given a dose of oxycodone given that she is not driving.  She requested a small amount to go home with.  She was advised of the high risk of addiction, and advised to use this only for breakthrough pain.  Also advised that she cannot drive, operate heavy machinery, or perform any test that require concentration while taking this medication.  She understands strict return precautions.  She was discharged in stable condition.  Patient was discussed with Dr. Cyril Loosen.   Patient's presentation is most consistent with acute illness / injury with system symptoms.       FINAL CLINICAL IMPRESSION(S) / ED DIAGNOSES   Final diagnoses:  Acute otitis media, unspecified otitis media type     Rx / DC Orders   ED Discharge Orders          Ordered    cefdinir (OMNICEF) 300 MG capsule  2 times daily        06/09/22 0847    oxyCODONE (ROXICODONE) 5 MG immediate release tablet  Every 8 hours PRN        06/09/22 4827             Note:  This document was prepared using Dragon voice recognition software and may include unintentional dictation errors.   Keturah Shavers 06/09/22 1057    Jene Every, MD 06/09/22 1442

## 2023-03-13 ENCOUNTER — Emergency Department
Admission: EM | Admit: 2023-03-13 | Discharge: 2023-03-13 | Disposition: A | Discharge status: Home / Self Care | Payer: BLUE CROSS/BLUE SHIELD | Attending: Emergency Medicine | Admitting: Emergency Medicine

## 2023-03-13 ENCOUNTER — Emergency Department: Payer: BLUE CROSS/BLUE SHIELD

## 2023-03-13 ENCOUNTER — Encounter: Payer: Self-pay | Admitting: Emergency Medicine

## 2023-03-13 ENCOUNTER — Other Ambulatory Visit: Payer: Self-pay

## 2023-03-13 DIAGNOSIS — M25521 Pain in right elbow: Secondary | ICD-10-CM | POA: Diagnosis not present

## 2023-03-13 DIAGNOSIS — E119 Type 2 diabetes mellitus without complications: Secondary | ICD-10-CM | POA: Diagnosis not present

## 2023-03-13 DIAGNOSIS — Y92009 Unspecified place in unspecified non-institutional (private) residence as the place of occurrence of the external cause: Secondary | ICD-10-CM

## 2023-03-13 DIAGNOSIS — S40011A Contusion of right shoulder, initial encounter: Secondary | ICD-10-CM | POA: Insufficient documentation

## 2023-03-13 DIAGNOSIS — I10 Essential (primary) hypertension: Secondary | ICD-10-CM | POA: Insufficient documentation

## 2023-03-13 DIAGNOSIS — T07XXXA Unspecified multiple injuries, initial encounter: Secondary | ICD-10-CM

## 2023-03-13 DIAGNOSIS — S20211A Contusion of right front wall of thorax, initial encounter: Secondary | ICD-10-CM | POA: Insufficient documentation

## 2023-03-13 DIAGNOSIS — S299XXA Unspecified injury of thorax, initial encounter: Secondary | ICD-10-CM | POA: Diagnosis present

## 2023-03-13 DIAGNOSIS — W182XXA Fall in (into) shower or empty bathtub, initial encounter: Secondary | ICD-10-CM | POA: Insufficient documentation

## 2023-03-13 DIAGNOSIS — Y92002 Bathroom of unspecified non-institutional (private) residence single-family (private) house as the place of occurrence of the external cause: Secondary | ICD-10-CM | POA: Diagnosis not present

## 2023-03-13 DIAGNOSIS — S60219A Contusion of unspecified wrist, initial encounter: Secondary | ICD-10-CM | POA: Diagnosis not present

## 2023-03-13 MED ORDER — HYDROCODONE-ACETAMINOPHEN 5-325 MG PO TABS
1.0000 | ORAL_TABLET | Freq: Once | ORAL | Status: AC
  Administered 2023-03-13: 1 via ORAL
  Filled 2023-03-13: qty 1

## 2023-03-13 MED ORDER — HYDROCODONE-ACETAMINOPHEN 5-325 MG PO TABS
1.0000 | ORAL_TABLET | Freq: Four times a day (QID) | ORAL | 0 refills | Status: AC | PRN
Start: 2023-03-13 — End: 2024-03-12

## 2023-03-13 NOTE — Discharge Instructions (Signed)
Follow-up with your primary care provider if any continued problems or concerns. Ice packs to areas to help with pain and reduce any swelling.  Take ibuprofen as needed for inflammation.  A prescription for hydrocodone was sent to the pharmacy for you to take as needed for pain.  No driving or operating machinery while taking this medication as it could cause drowsiness and increase your risk for injury.

## 2023-03-13 NOTE — ED Notes (Signed)
See triage note  Presents s/p fall  States she fell getting out of the tub States she landed on right shoulder  States her arm was bent behind her  Good pulses

## 2023-03-13 NOTE — ED Provider Notes (Signed)
[facility] Provider Note    Event Date/Time   First MD Initiated Contact with Patient 03/13/23 [phone]     (approximate)   History   Fall   HPI  Helen Webster is a 47 y.o. female presents to the ED with complaint of right shoulder pain, right elbow pain and right rib pain after a fall that occurred last evening in her bathtub.  Patient denies any head injury or loss of consciousness.  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  Patient has history of hypertension, SVT, diabetes type 2 and migraines.     Physical Exam   Triage Vital Signs: ED Triage Vitals  Encounter Vitals Group     BP 03/13/23 0727 (!) 160/97     Systolic BP Percentile --      Diastolic BP Percentile --      Pulse Rate 03/13/23 0727 98     Resp 03/13/23 0727 16     Temp 03/13/23 0727 98.9 F (37.2 C)     Temp Source 03/13/23 0727 Oral     SpO2 03/13/23 0727 100 %     Weight 03/13/23 0728 175 lb 0.7 oz (79.4 kg)     Height 03/13/23 0728 5\' 5"  (1.651 m)     Head Circumference --      Peak Flow --      Pain Score 03/13/23 0728 10     Pain Loc --      Pain Education --      Exclude from Growth Chart --     Most recent vital signs: Vitals:   03/13/23 0727  BP: (!) 160/97  Pulse: 98  Resp: 16  Temp: 98.9 F (37.2 C)  SpO2: 100%     General: Awake, no distress.  CV:  Good peripheral perfusion.  Resp:  Normal effort.  Mild to moderate tenderness on palpation of the right ribs lateral aspect.  No deformity or crepitus. Abd:  No distention.  Soft. Other:  Nontender cervical spine to palpation posteriorly.  Moderate tenderness to palpation to the right shoulder both anterior and posterior without obvious deformity.  Posterior olecranon and medial aspect with tenderness and patient is guarding against movement secondary to pain.  Mild tenderness on palpation of the wrist without soft tissue edema, discoloration or deformity.  Radial  pulse present.  Skin is intact.   ED Results / Procedures / Treatments   Labs (all labs ordered are listed, but only abnormal results are displayed) Labs Reviewed - No data to display    RADIOLOGY  X-ray images of the right shoulder, right lateral ribs and right forearm images were reviewed and interpreted by myself independent of the radiologist and was negative for fracture or dislocation.  Radiology report confirms.   PROCEDURES:  Critical Care performed:   Procedures   MEDICATIONS ORDERED IN ED: Medications  HYDROcodone-acetaminophen (NORCO/VICODIN) 5-325 MG per tablet 1 tablet (1 tablet Oral Given 03/13/23 0847)     IMPRESSION / MDM / ASSESSMENT AND PLAN / ED COURSE  I reviewed the triage vital signs and the nursing notes.   Differential diagnosis includes, but is not limited to, multiple contusions, fracture, dislocation, muscle strain, sprain secondary to fall.  47 year old female presents to the ED with multiple complaints after a fall that occurred in her bathtub last evening.  X-rays of the right forearm, right shoulder and right ribs were obtained and reassuringly negative for fracture or dislocation.  Patient was made aware.  Hydrocodone was given to her while in the emergency department.  A prescription for the same was sent to her pharmacy.  She is encouraged to use ice to her muscles and joint as needed for discomfort and/or swelling.  Patient is aware that she cannot take the narcotic pain medication while driving or at work.  She is encouraged to take ibuprofen otherwise.  Patient will follow-up with her PCP if any continued problems.      Patient's presentation is most consistent with acute complicated illness / injury requiring diagnostic workup.  FINAL CLINICAL IMPRESSION(S) / ED DIAGNOSES   Final diagnoses:  Multiple contusions  Fall in home, initial encounter     Rx / DC Orders   ED Discharge Orders          Ordered     HYDROcodone-acetaminophen (NORCO/VICODIN) 5-325 MG tablet  Every 6 hours PRN        03/13/23 0857             Note:  This document was prepared using Dragon voice recognition software and may include unintentional dictation errors.   Tommi Rumps, PA-C 03/13/23 1610    Jene Every, MD 03/13/23 1056

## 2023-03-13 NOTE — ED Triage Notes (Signed)
Fell last evening while getting out of the bathrtub.  Arrives c/o right shoulder and elbow pain

## 2023-04-13 ENCOUNTER — Ambulatory Visit (LOCAL_COMMUNITY_HEALTH_CENTER): Payer: Self-pay

## 2023-04-13 DIAGNOSIS — Z111 Encounter for screening for respiratory tuberculosis: Secondary | ICD-10-CM

## 2023-04-16 ENCOUNTER — Ambulatory Visit: Payer: Self-pay

## 2023-04-16 DIAGNOSIS — Z111 Encounter for screening for respiratory tuberculosis: Secondary | ICD-10-CM

## 2023-04-16 LAB — TB SKIN TEST
Induration: 0 mm
TB Skin Test: NEGATIVE

## 2023-04-27 ENCOUNTER — Other Ambulatory Visit: Payer: Self-pay

## 2023-04-27 ENCOUNTER — Emergency Department
Admission: EM | Admit: 2023-04-27 | Discharge: 2023-04-27 | Disposition: A | Discharge status: Home / Self Care | Payer: BLUE CROSS/BLUE SHIELD | Attending: Emergency Medicine | Admitting: Emergency Medicine

## 2023-04-27 ENCOUNTER — Emergency Department: Payer: BLUE CROSS/BLUE SHIELD

## 2023-04-27 DIAGNOSIS — R509 Fever, unspecified: Secondary | ICD-10-CM | POA: Diagnosis present

## 2023-04-27 DIAGNOSIS — Z20822 Contact with and (suspected) exposure to covid-19: Secondary | ICD-10-CM | POA: Diagnosis not present

## 2023-04-27 DIAGNOSIS — E119 Type 2 diabetes mellitus without complications: Secondary | ICD-10-CM | POA: Insufficient documentation

## 2023-04-27 DIAGNOSIS — J40 Bronchitis, not specified as acute or chronic: Secondary | ICD-10-CM | POA: Insufficient documentation

## 2023-04-27 LAB — RESP PANEL BY RT-PCR (RSV, FLU A&B, COVID)  RVPGX2
Influenza A by PCR: NEGATIVE
Influenza B by PCR: NEGATIVE
Resp Syncytial Virus by PCR: NEGATIVE
SARS Coronavirus 2 by RT PCR: NEGATIVE

## 2023-04-27 LAB — TROPONIN I (HIGH SENSITIVITY): Troponin I (High Sensitivity): 3 ng/L (ref <18)

## 2023-04-27 LAB — BASIC METABOLIC PANEL
Anion gap: 12 (ref 5–15)
BUN: 9 mg/dL (ref 6–20)
CO2: 24 mmol/L (ref 22–32)
Calcium: 8.7 mg/dL — ABNORMAL LOW (ref 8.9–10.3)
Chloride: 97 mmol/L — ABNORMAL LOW (ref 98–111)
Creatinine, Ser: 0.58 mg/dL (ref 0.44–1.00)
GFR, Estimated: >60 mL/min (ref >60)
Glucose, Bld: 272 mg/dL — ABNORMAL HIGH (ref 70–99)
Potassium: 3.6 mmol/L (ref 3.5–5.1)
Sodium: 133 mmol/L — ABNORMAL LOW (ref 135–145)

## 2023-04-27 LAB — CBC
HCT: 30.1 % — ABNORMAL LOW (ref 36.0–46.0)
Hemoglobin: 9.7 g/dL — ABNORMAL LOW (ref 12.0–15.0)
MCH: 26.5 pg (ref 26.0–34.0)
MCHC: 32.2 g/dL (ref 30.0–36.0)
MCV: 82.2 fL (ref 80.0–100.0)
Platelets: 327 10*3/uL (ref 150–400)
RBC: 3.66 MIL/uL — ABNORMAL LOW (ref 3.87–5.11)
RDW: 12.9 % (ref 11.5–15.5)
WBC: 10.2 10*3/uL (ref 4.0–10.5)
nRBC: 0 % (ref 0.0–0.2)

## 2023-04-27 MED ORDER — IPRATROPIUM-ALBUTEROL 0.5-2.5 (3) MG/3ML IN SOLN
3.0000 mL | Freq: Once | RESPIRATORY_TRACT | Status: AC
  Administered 2023-04-27: 3 mL via RESPIRATORY_TRACT
  Filled 2023-04-27: qty 3

## 2023-04-27 MED ORDER — ALBUTEROL SULFATE HFA 108 (90 BASE) MCG/ACT IN AERS: 2.0000 | INHALATION_SPRAY | Freq: Four times a day (QID) | RESPIRATORY_TRACT | 2 refills | Status: AC | PRN

## 2023-04-27 MED ORDER — KETOROLAC TROMETHAMINE 30 MG/ML IJ SOLN
30.0000 mg | Freq: Once | INTRAMUSCULAR | Status: AC
  Administered 2023-04-27: 30 mg via INTRAMUSCULAR
  Filled 2023-04-27: qty 1

## 2023-04-27 NOTE — ED Triage Notes (Signed)
Pt to ED for cough x1 week. +chills, body aches. Denies shob. Reports constant chest discomfort. Tested neg for covid this am. +h/a RR even and unlabored, "NAD noted, on phone in triage.

## 2023-04-27 NOTE — ED Provider Notes (Signed)
[facility] Provider Note    Event Date/Time   First MD Initiated Contact with Patient 04/27/23 0818     (approximate)   History   Cough and Chest Pain   HPI  Helen Webster is a 47 y.o. female who reports that she developed upper respiratory infection 1 week ago with fevers, cough, runny nose, cough has continued however she reports it is frequent and she feels tightness in her chest.  She is diabetic.     Physical Exam   Triage Vital Signs: ED Triage Vitals [04/27/23 0721]  Encounter Vitals Group     BP (!) 181/106     Systolic BP Percentile      Diastolic BP Percentile      Pulse Rate 94     Resp 20     Temp 98.9 F (37.2 C)     Temp src      SpO2 96 %     Weight 81.6 kg (180 lb)     Height 1.651 m (5\' 5" )     Head Circumference      Peak Flow      Pain Score 10     Pain Loc      Pain Education      Exclude from Growth Chart     Most recent vital signs: Vitals:   04/27/23 0841 04/27/23 1133  BP: (!) 146/99 (!) 140/88  Pulse:  80  Resp:  20  Temp:  98 F (36.7 C)  SpO2:  96%     General: Awake, no distress.  CV:  Good peripheral perfusion.  Resp:  Normal effort.  Frequent coughing, scattered mild wheezes Abd:  No distention.  Other:     ED Results / Procedures / Treatments   Labs (all labs ordered are listed, but only abnormal results are displayed) Labs Reviewed  BASIC METABOLIC PANEL - Abnormal; Notable for the following components:      Result Value   Sodium 133 (*)    Chloride 97 (*)    Glucose, Bld 272 (*)    Calcium 8.7 (*)    All other components within normal limits  CBC - Abnormal; Notable for the following components:   RBC 3.66 (*)    Hemoglobin 9.7 (*)    HCT 30.1 (*)    All other components within normal limits  RESP PANEL BY RT-PCR (RSV, FLU A&B, COVID)  [id number]  POC URINE PREG, ED  TROPONIN I (HIGH SENSITIVITY)     EKG  ED ECG REPORT I, Jene Every, the attending physician, personally  viewed and interpreted this ECG.  Date: 04/27/2023  Rhythm: normal sinus rhythm QRS Axis: normal Intervals: normal ST/T Wave abnormalities: normal Narrative Interpretation: no evidence of acute ischemia    RADIOLOGY X-ray viewed interpret by me, no pneumonia    PROCEDURES:  Critical Care performed:   Procedures   MEDICATIONS ORDERED IN ED: Medications  ipratropium-albuterol (DUONEB) 0.5-2.5 (3) MG/3ML nebulizer solution 3 mL (3 mLs Nebulization Given 04/27/23 1047)  ipratropium-albuterol (DUONEB) 0.5-2.5 (3) MG/3ML nebulizer solution 3 mL (3 mLs Nebulization Given 04/27/23 1047)  ketorolac (TORADOL) 30 MG/ML injection 30 mg (30 mg Intramuscular Given 04/27/23 1044)     IMPRESSION / MDM / ASSESSMENT AND PLAN / ED COURSE  I reviewed the triage vital signs and the nursing notes. Patient's presentation is most consistent with acute illness / injury with system symptoms.  Patient presents with cough, mild chest discomfort in the setting of recent  upper respiratory infection.  Suspect acute bronchitis with bronchospasm, differential includes pneumonia.  X-ray is reassuring, lab work overall unremarkable, discussed with patient mild anemia compared to last year and need for follow-up with PCP  Treat with DuoNebs, reevaluate, will avoid steroids given diabetic status.  Patient feeling improved after DuoNebs, IM Toradol given for mild headache as well which she reports is common for her.  No indication for admission at this time, appropriate for discharge with outpatient follow-up      FINAL CLINICAL IMPRESSION(S) / ED DIAGNOSES   Final diagnoses:  Bronchitis     Rx / DC Orders   ED Discharge Orders          Ordered    albuterol (VENTOLIN HFA) 108 (90 Base) MCG/ACT inhaler  Every 6 hours PRN        04/27/23 1137             Note:  This document was prepared using Dragon voice recognition software and may include unintentional dictation errors.   Jene Every, MD 04/27/23 1355

## 2023-04-27 NOTE — ED Notes (Signed)
Pt d/c home per EDP order. Discharge summary reviewed, verbalize understanding. NAD noted.

## 2023-05-03 ENCOUNTER — Emergency Department: Payer: BLUE CROSS/BLUE SHIELD

## 2023-05-03 ENCOUNTER — Encounter: Payer: Self-pay | Admitting: Emergency Medicine

## 2023-05-03 ENCOUNTER — Other Ambulatory Visit: Payer: Self-pay

## 2023-05-03 ENCOUNTER — Emergency Department
Admission: EM | Admit: 2023-05-03 | Discharge: 2023-05-03 | Disposition: A | Discharge status: Home / Self Care | Payer: BLUE CROSS/BLUE SHIELD | Attending: Emergency Medicine | Admitting: Emergency Medicine

## 2023-05-03 DIAGNOSIS — R002 Palpitations: Secondary | ICD-10-CM | POA: Insufficient documentation

## 2023-05-03 DIAGNOSIS — R0789 Other chest pain: Secondary | ICD-10-CM | POA: Diagnosis not present

## 2023-05-03 DIAGNOSIS — E119 Type 2 diabetes mellitus without complications: Secondary | ICD-10-CM | POA: Insufficient documentation

## 2023-05-03 DIAGNOSIS — R0602 Shortness of breath: Secondary | ICD-10-CM | POA: Insufficient documentation

## 2023-05-03 LAB — BASIC METABOLIC PANEL
Anion gap: 11 (ref 5–15)
BUN: 9 mg/dL (ref 6–20)
CO2: 25 mmol/L (ref 22–32)
Calcium: 8.8 mg/dL — ABNORMAL LOW (ref 8.9–10.3)
Chloride: 95 mmol/L — ABNORMAL LOW (ref 98–111)
Creatinine, Ser: 0.56 mg/dL (ref 0.44–1.00)
GFR, Estimated: >60 mL/min (ref >60)
Glucose, Bld: 324 mg/dL — ABNORMAL HIGH (ref 70–99)
Potassium: 3.6 mmol/L (ref 3.5–5.1)
Sodium: 131 mmol/L — ABNORMAL LOW (ref 135–145)

## 2023-05-03 LAB — CBC
HCT: 31 % — ABNORMAL LOW (ref 36.0–46.0)
Hemoglobin: 10.1 g/dL — ABNORMAL LOW (ref 12.0–15.0)
MCH: 26.4 pg (ref 26.0–34.0)
MCHC: 32.6 g/dL (ref 30.0–36.0)
MCV: 80.9 fL (ref 80.0–100.0)
Platelets: 327 10*3/uL (ref 150–400)
RBC: 3.83 MIL/uL — ABNORMAL LOW (ref 3.87–5.11)
RDW: 13.1 % (ref 11.5–15.5)
WBC: 9.9 10*3/uL (ref 4.0–10.5)
nRBC: 0 % (ref 0.0–0.2)

## 2023-05-03 LAB — TROPONIN I (HIGH SENSITIVITY)
Troponin I (High Sensitivity): 3 ng/L (ref <18)
Troponin I (High Sensitivity): 4 ng/L (ref <18)

## 2023-05-03 NOTE — ED Provider Notes (Signed)
[facility] Provider Note    Event Date/Time   First MD Initiated Contact with Patient 05/03/23 0809     (approximate)   History   Chest Pain   HPI  Helen Webster is a 47 y.o. female with history of diabetes, who presents with palpitations and chest discomfort and shortness of breath.  Patient reports she was at work, she works overnight.  At around 5 or 6 AM she felt her heart beating rapidly and felt chest tightness and shortness of breath.  She feels better now.  She does report a distant history of SVT in the past for which she required adenosine     Physical Exam   Triage Vital Signs: ED Triage Vitals [05/03/23 0721]  Encounter Vitals Group     BP (!) 167/103     Systolic BP Percentile      Diastolic BP Percentile      Pulse Rate 92     Resp 18     Temp 98.8 F (37.1 C)     Temp Source Oral     SpO2 99 %     Weight 81.6 kg (180 lb)     Height 1.651 m (5\' 5" )     Head Circumference      Peak Flow      Pain Score 7     Pain Loc      Pain Education      Exclude from Growth Chart     Most recent vital signs: Vitals:   05/03/23 0721  BP: (!) 167/103  Pulse: 92  Resp: 18  Temp: 98.8 F (37.1 C)  SpO2: 99%     General: Awake, no distress.  CV:  Good peripheral perfusion.  No murmur, regular rhythm Resp:  Normal effort.  Auscultation bilaterally, no tachypnea Abd:  No distention.  Other:  No lower extremity swelling or edema   ED Results / Procedures / Treatments   Labs (all labs ordered are listed, but only abnormal results are displayed) Labs Reviewed  BASIC METABOLIC PANEL - Abnormal; Notable for the following components:      Result Value   Sodium 131 (*)    Chloride 95 (*)    Glucose, Bld 324 (*)    Calcium 8.8 (*)    All other components within normal limits  CBC - Abnormal; Notable for the following components:   RBC 3.83 (*)    Hemoglobin 10.1 (*)    HCT 31.0 (*)    All other components within normal limits   POC URINE PREG, ED  TROPONIN I (HIGH SENSITIVITY)  TROPONIN I (HIGH SENSITIVITY)     EKG  ED ECG REPORT I, Jene Every, the attending physician, personally viewed and interpreted this ECG.  Date: 05/03/2023  Rhythm: normal sinus rhythm QRS Axis: normal Intervals: normal ST/T Wave abnormalities: normal Narrative Interpretation: no evidence of acute ischemia    RADIOLOGY X-ray viewed interpret by me, no acute abnormality    PROCEDURES:  Critical Care performed:   Procedures   MEDICATIONS ORDERED IN ED: Medications - No data to display   IMPRESSION / MDM / ASSESSMENT AND PLAN / ED COURSE  I reviewed the triage vital signs and the nursing notes. Patient's presentation is most consistent with acute presentation with potential threat to life or bodily function.  Patient presents with chest pain shortness of breath, palpitations as above.  Differential includes arrhythmia including SVT, tachycardia  EKG is reassuring here, high sensitive troponin is  normal, will check delta troponin.  Chest x-ray without abnormality.  No new medications, not taking increased caffeine or substances  Second troponin is unremarkable, no further palpitations, patient feeling well, appropriate for discharge with close cardiology follow-up, referral placed, return precautions discussed        FINAL CLINICAL IMPRESSION(S) / ED DIAGNOSES   Final diagnoses:  Palpitations     Rx / DC Orders   ED Discharge Orders          Ordered    Ambulatory referral to Cardiology       Comments: If you have not heard from the Cardiology office within the next 72 hours please call [phone].   05/03/23 0950             Note:  This document was prepared using Dragon voice recognition software and may include unintentional dictation errors.   Jene Every, MD 05/03/23 1056

## 2023-05-03 NOTE — ED Notes (Signed)
See triage note  Presents with some chest tightness and SOB  States sx's started at [facility] this am  First with palpations  then some SOB  On arrival RRR

## 2023-05-03 NOTE — ED Triage Notes (Signed)
Patient to ED via POV for centralized chest pain. Started this AM while at work- states she had heart palpitations and SOB. Here on Friday dx with bronchitis. Taking an inhaler for same. Denies cardiac hx. Hx of hypertension, diabetes

## 2023-10-27 IMAGING — CR DG CHEST 2V
1 series · 2 of 2 positions shown · non-contrast
Comparison: October 08, 20.

CLINICAL DATA: Chest pain.

EXAM:
CHEST - 2 VIEW

[Series 1: dg chest 2 view · 0.14mm/px · 2 of 2 slices shown]
[im 1/2]
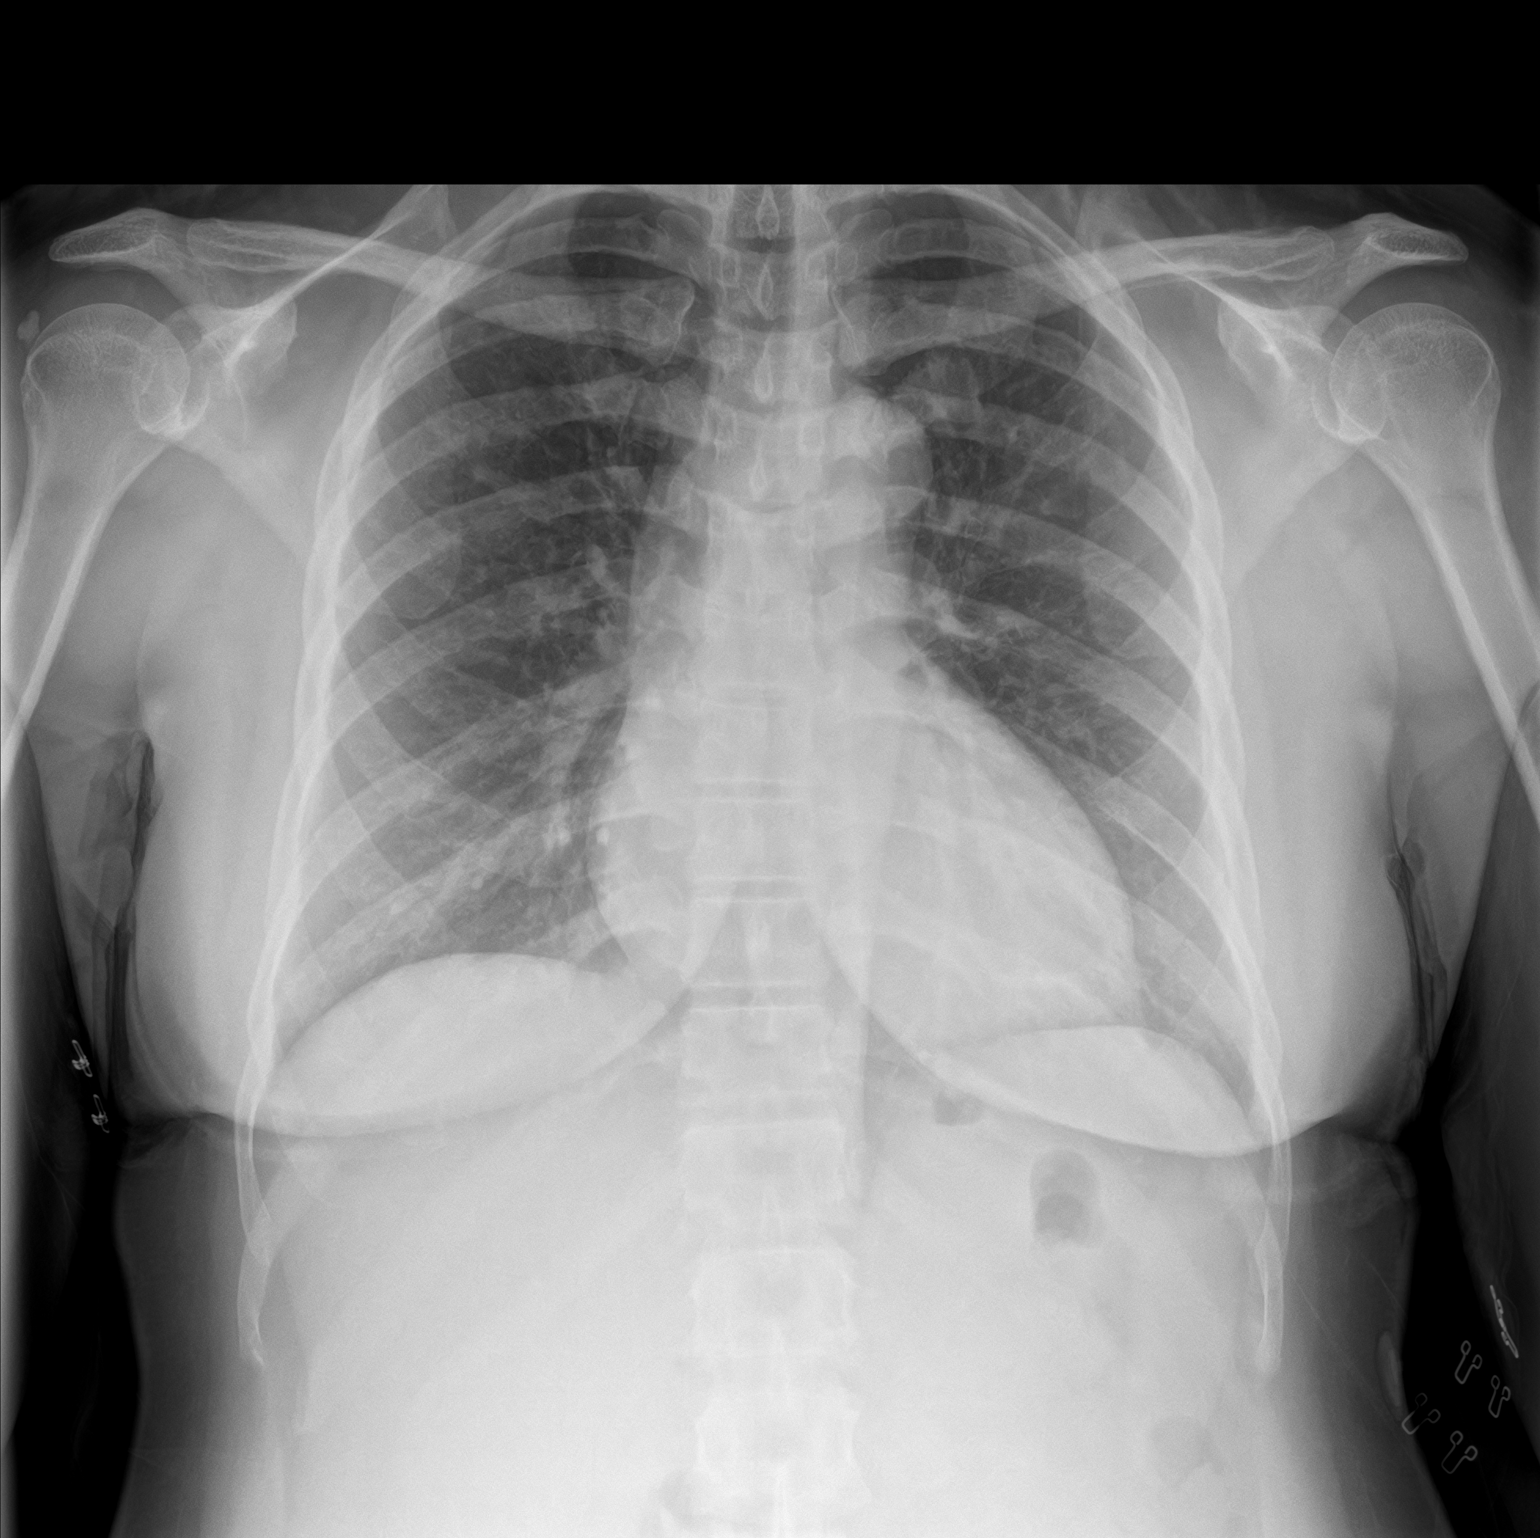
[im 2/2]
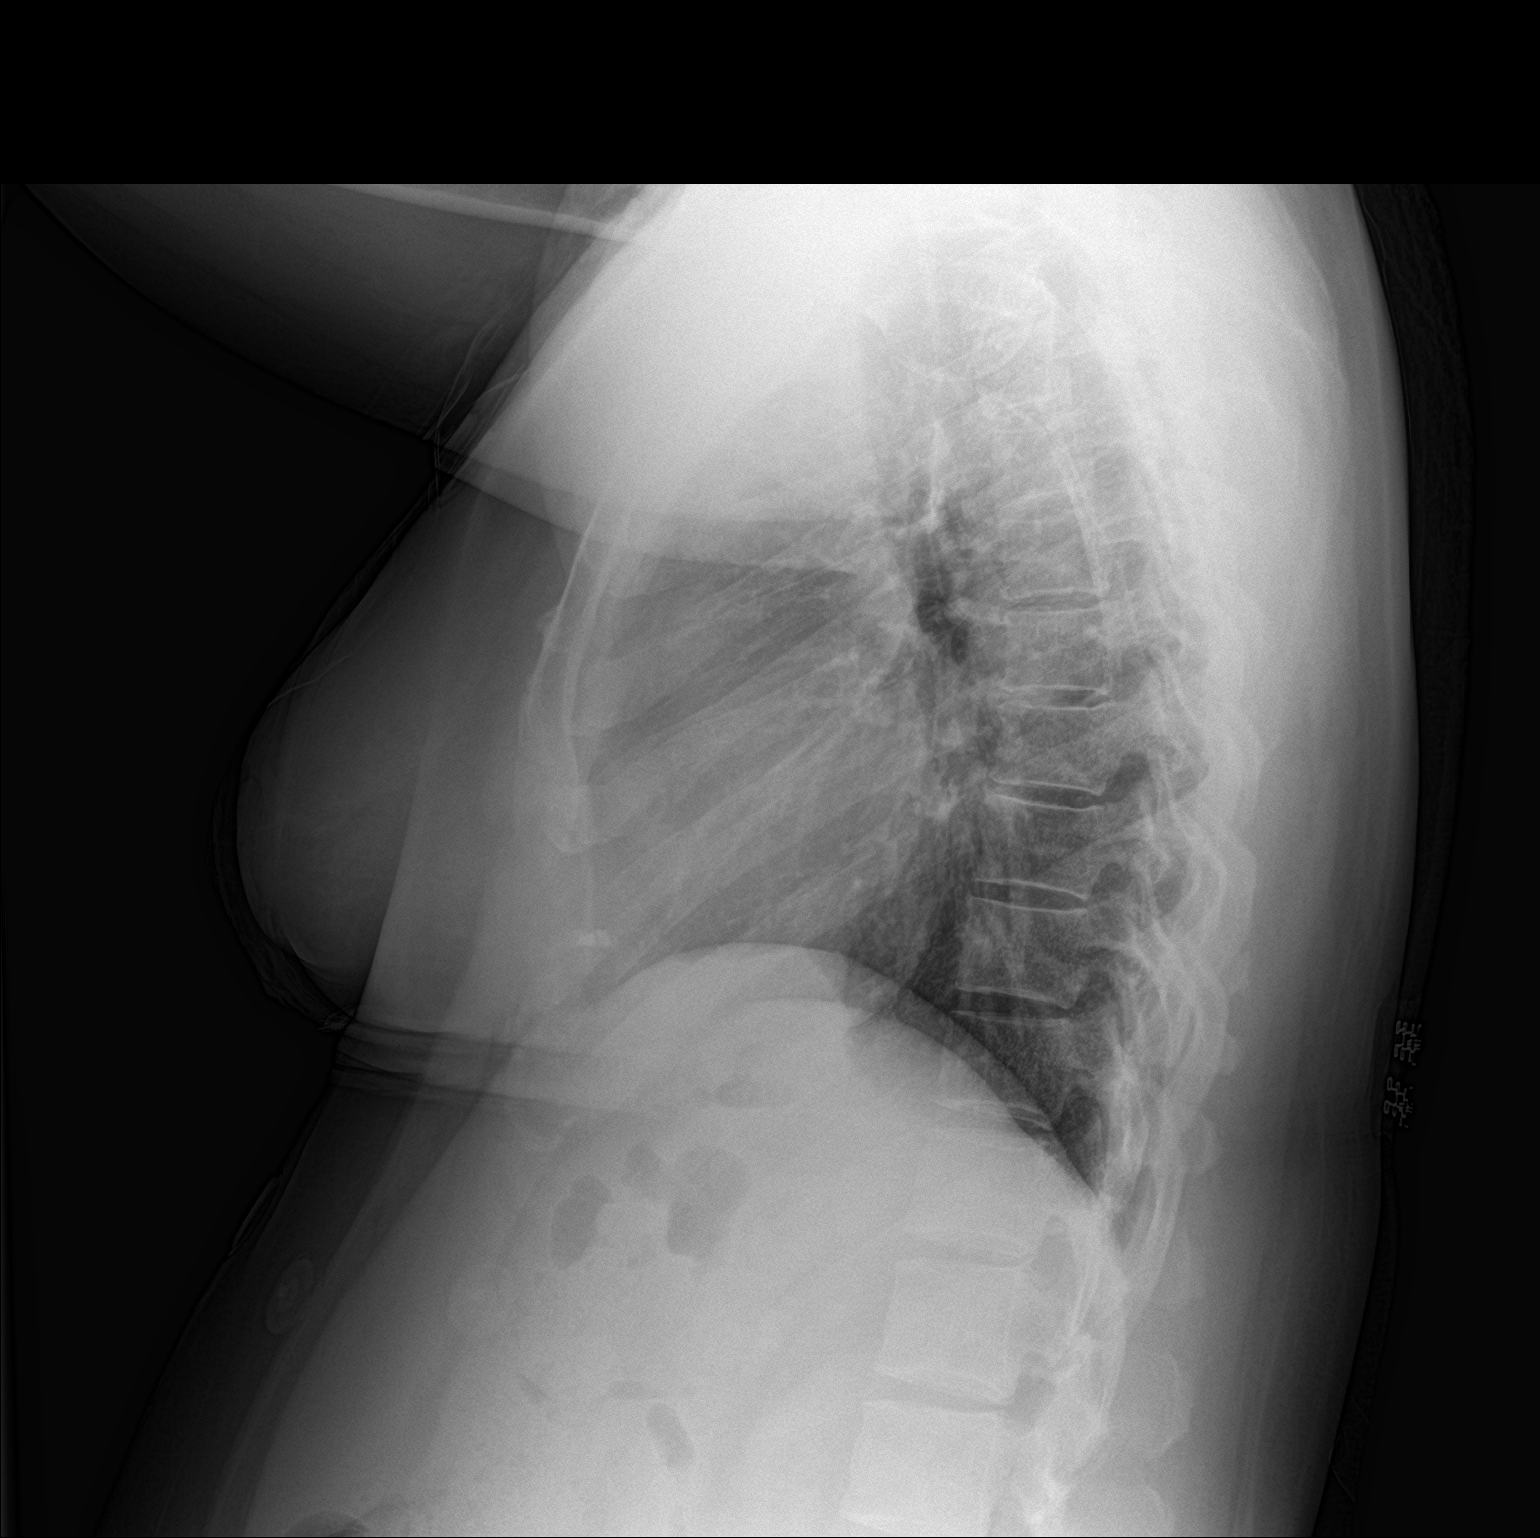

[2 of 2 positions shown; findings below may reference images not displayed]

FINDINGS: No consolidation. No visible pleural effusions or pneumothorax.
Similar enlargement of the cardiac silhouette. No evidence of acute
osseous abnormality.
IMPRESSION: 1. No evidence of acute cardiopulmonary disease.
2. Similar cardiomegaly.
